# Patient Record
Sex: Female | Born: 1990 | Race: White | Hispanic: No | Marital: Single | Smoking: Never smoker
Health system: Southern US, Community
[De-identification: ages and names within clinical notes are randomized; demographics above are authoritative.]

## PROBLEM LIST (undated history)

## (undated) ENCOUNTER — Inpatient Hospital Stay (HOSPITAL_COMMUNITY): Payer: Self-pay

## (undated) DIAGNOSIS — Z9889 Other specified postprocedural states: Secondary | ICD-10-CM

## (undated) DIAGNOSIS — R51 Headache: Secondary | ICD-10-CM

## (undated) DIAGNOSIS — R011 Cardiac murmur, unspecified: Secondary | ICD-10-CM

## (undated) DIAGNOSIS — R519 Headache, unspecified: Secondary | ICD-10-CM

## (undated) DIAGNOSIS — R112 Nausea with vomiting, unspecified: Secondary | ICD-10-CM

## (undated) HISTORY — PX: MOUTH SURGERY: SHX715

## (undated) HISTORY — PX: TONSILLECTOMY: SUR1361

## (undated) HISTORY — DX: Cardiac murmur, unspecified: R01.1

## (undated) HISTORY — PX: AORTIC VALVE SURGERY: SHX549

---
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
## 2012-01-02 ENCOUNTER — Ambulatory Visit (INDEPENDENT_AMBULATORY_CARE_PROVIDER_SITE_OTHER): Payer: Medicaid Other | Admitting: Obstetrics and Gynecology

## 2012-01-02 ENCOUNTER — Encounter: Payer: Self-pay | Admitting: Obstetrics and Gynecology

## 2012-01-02 VITALS — BP 111/70 | HR 79 | Temp 97.8°F | Ht 60.0 in | Wt 158.0 lb

## 2012-01-02 DIAGNOSIS — Z309 Encounter for contraceptive management, unspecified: Secondary | ICD-10-CM

## 2012-01-02 MED ORDER — NORELGESTROMIN-ETH ESTRADIOL 150-35 MCG/24HR TD PTWK: 1.0000 | MEDICATED_PATCH | TRANSDERMAL | Status: DC

## 2012-01-02 NOTE — Progress Notes (Signed)
CC: Contraception       HPI Jo [name] ia a 21 y.o. G1P1001 here to initiate contraception. She does not want OCPs because of eating amenorrheic after taking the pills in the past. Does not want  IUD or new her due to concerns with her cervix as her mother had cervical cancer. Does not want V. She does not like injections. Does not want the implant.  Amenorrheic and no vaginal bleeding since her baby was born one year ago. She is breast-feeding 4 times a day. She would like to talk to lactation consultant regarding weaning.  Past Medical History  Diagnosis Date  . Heart murmur     bicuspid aorta valve    OB History    Grav Para Term Preterm Abortions TAB SAB Ect Mult Living   1 1 1       1      # Outc Date GA Lbr Len/2nd Wgt Sex Del Anes PTL Lv   1 TRM 10/12 [redacted]w[redacted]d 33:19 / 05:13 8lb15.9oz(4.08kg) F VAC EPI  Yes   Comments: Has a sacral dimple that appears deep.  Further evaluation with imaging study can be done.      Past Surgical History  Procedure Date  . No past surgeries   . Aortic valve surgery 1994    Stated in Prenatal Record that she had surgery as baby to repair valve, but told our MD  the valve corrected itself    History   Social History  . Marital Status: Single    Spouse Name: N/A    Number of Children: N/A  . Years of Education: N/A   Occupational History  . Not on file.   Social History Main Topics  . Smoking status: Never Smoker   . Smokeless tobacco: Never Used  . Alcohol Use: No  . Drug Use: No  . Sexually Active: Yes   Other Topics Concern  . Not on file   Social History Narrative  . No narrative on file    Current Outpatient Prescriptions on File Prior to Visit  Medication Sig Dispense Refill  . prenatal vitamin w/FE, FA (PRENATAL 1 + 1) 27-1 MG TABS Take 1 tablet by mouth at bedtime.        . ferrous sulfate (FERROUSUL) 325 (65 FE) MG tablet Take 1 tablet (325 mg total) by mouth 2 (two) times daily.  60 tablet  11  . norethindrone  (ORTHO MICRONOR) 0.35 MG tablet Take 1 tablet (0.35 mg total) by mouth daily.  1 Package  13    Allergies  Allergen Reactions  . Lemon Flavor   . Orange Games developer   . Aspirin Other (See Comments)    Pt states that the drug does not "break-down"  in her body.  . Penicillins Other (See Comments)    Happened in childhood,reaction unknown    ROS Pertinent items in HPI  PHYSICAL EXAM General: Well nourished, well developed female in no acute distress Cardiovascular: Normal rate Respiratory: Normal effort Abdomen: Soft, nontender Back: No CVAT Extremities: No edema Neurologic: Alert and oriented   LAB RESULTS  UPT neg  IASSESSMENT 1. Contraceptive management   Overweight  PLAN Counselor at length re: contraception methods encouraging LARC. Understands that she does not have a contraindication and understands effectiveness rates. She decided on contraceptive patch and prescription given. She knows to have it negative pregnancy test once more after being abstinent for 2 weeks and then start it.  See AVS for patient education   Med  List    Warning       Cannot display discharge medications because this is not an admission.          Danae Orleans, CNM 01/02/2012 10:28 AM

## 2012-01-02 NOTE — Patient Instructions (Addendum)
Estradiol; Norethindrone skin patches What is this medicine? ESTRADIOL; NORETHINDRONE (es tra DYE ole; nor eth IN drone) contains a mixture of female hormones. This medicine helps to relieve the symptoms of menopause like hot flashes, night sweats, mood changes, and vaginal dryness and irritation. It is also used to treat women with low estrogen levels or those who have had their ovaries removed. This medicine may be used for other purposes; ask your health care provider or pharmacist if you have questions. What should I tell my health care provider before I take this medicine? They need to know if you have any of these conditions: -blood vessel disease or blood clots -breast, cervical, endometrial, or uterine cancer -diabetes -endometriosis -fibroids -gallbladder disease -heart disease or recent heart attack -high blood cholesterol -high blood pressure -high level of calcium in the blood -hysterectomy -kidney disease -liver disease -mental depression -migraine headaches -porphyria -stroke -systemic lupus erythematosus (SLE) -tobacco smoker -vaginal bleeding -an unusual or allergic reaction to estrogens, progestins, other medicines, foods, dyes, or preservatives -pregnant or trying to get pregnant -breast-feeding How should I use this medicine? This medicine is for external use only. Follow the directions on the prescription label. Use exactly as directed. Tear open the pouch, do not use scissors. Remove the stiff protective liner covering the adhesive. Try not to touch the adhesive. Apply the patch, sticky side to the skin, to an area of the lower abdomen that is clean, dry and hairless. Avoid injured, irritated, calloused, or scarred areas. Do not apply the skin patches to your breasts or around the waist area. Use a different site each time to prevent skin irritation. You should change your patch on the same days each week. Do not cut or trim the patch. Do not stop using except on  the advice of your doctor or health care professional. Talk to your pediatrician regarding the use of this medicine in children. Special care may be needed. A patient package insert for the product will be given with each prescription and refill. Read this sheet carefully each time. The sheet may change frequently. Overdosage: If you think you have taken too much of this medicine contact a poison control center or emergency room at once. NOTE: This medicine is only for you. Do not share this medicine with others. What if I miss a dose? If you forget to change your patch as scheduled, apply it as soon as possible. Remember to remove the old patch. If it is almost time to apply the next patch, skip the missed patch and get back on your normal schedule. Do not wear more than one patch at a time unless you are told to do so by your doctor or health care professional. What may interact with this medicine? Do not take this medicine with any of the following medications: -aromatase inhibitors like aminoglutethimide, anastrozole, exemestane, letrozole, testolactone This medicine may also interact with the following medications: -barbiturates, such as phenobarbital -benzodiazepines -bosentan -bromocriptine -carbamazepine -cimetidine -cyclosporine -dantrolene -grapefruit juice -griseofulvin -hydrocortisone, cortisone, or prednisolone -isoniazid (INH) -medications for diabetes -methotrexate -mineral oil -phenytoin -raloxifene -rifabutin, rifampin, or rifapentine -tamoxifen -thyroid hormones -topiramate -tricyclic antidepressants -warfarin This list may not describe all possible interactions. Give your health care provider a list of all the medicines, herbs, non-prescription drugs, or dietary supplements you use. Also tell them if you smoke, drink alcohol, or use illegal drugs. Some items may interact with your medicine. What should I watch for while using this medicine? Visit your health  care professional  for regular checks on your progress. You should have a complete check-up every 6 months. You will need a regular breast and pelvic exam. You should also discuss the need for regular mammograms with your health care professional, and follow his or her guidelines. This medicine can make your body retain fluid, making your fingers, hands, or ankles swell. Your blood pressure can go up. Contact your doctor or health care professional if you feel you are retaining fluid. If you have any reason to think you are pregnant; stop taking this medicine at once and contact your doctor or health care professional. Tobacco smoking increases the risk of getting a blood clot or having a stroke, especially if you are more than 19 years old. You are strongly advised not to smoke. If you wear contact lenses and notice visual changes, or if the lenses begin to feel uncomfortable, consult your eye care specialist. If you are going to have elective surgery, you may need to stop taking this medicine beforehand. Consult your health care professional for advice prior to scheduling the surgery. If you are going to have a MRI procedure, let your MRI technician know about the use of these patches. Some drug patches contain an aluminized backing that can become heated when exposed to MRI and may cause burns. You may need to temporarily remove the patch during the MRI procedure. You may bathe or participate in other activities while wearing your patch. If the patch pulls loose or falls off, you may reapply it if the patch is sticky enough to stay on the skin. You should reapply the patch in a different area. Otherwise use a fresh patch. What side effects may I notice from receiving this medicine? Side effects that you should report to your doctor or health care professional as soon as possible: -allergic reactions like skin rash, itching or hives, swelling of the face, lips, or tongue -breast tissue changes or  discharge -changes in vision -chest pain -confusion, trouble speaking or understanding -dark urine -general ill feeling or flu-like symptoms -light-colored stools -nausea, vomiting -pain, swelling, warmth in the leg -right upper belly pain -severe headaches -shortness of breath -sudden numbness or weakness of the face, arm or leg -trouble walking, dizziness, loss of balance or coordination -unusual vaginal bleeding -yellowing of the eyes or skin Side effects that usually do not require medical attention (report to your doctor or health care professional if they continue or are bothersome): -acne -brown spots on the face -change in appetite -change in sexual desire -depressed mood or mood swings -fluid retention and swelling -stomach cramps or bloating -unusually weak or tired -weight gain This list may not describe all possible side effects. Call your doctor for medical advice about side effects. You may report side effects to FDA at 1-800-FDA-1088. Where should I keep my medicine? Keep out of the reach of children. Store at room temperature between 15 and 30 degrees C (59 and 86 degrees F) in the sealed foil pouch. Throw away any unused medicine after 6 months or the expiration date on the package, whichever is sooner. NOTE: This sheet is a summary. It may not cover all possible information. If you have questions about this medicine, talk to your doctor, pharmacist, or health care provider.  2012, [employer]. (02/10/2008 2:04:37 PM)

## 2012-06-21 ENCOUNTER — Encounter (HOSPITAL_COMMUNITY): Payer: Self-pay | Admitting: *Deleted

## 2012-06-21 ENCOUNTER — Inpatient Hospital Stay (HOSPITAL_COMMUNITY)
Admission: AD | Admit: 2012-06-21 | Discharge: 2012-06-21 | Disposition: A | Discharge status: Home / Self Care | Payer: Medicaid Other | Source: Ambulatory Visit | Attending: Obstetrics & Gynecology | Admitting: Obstetrics & Gynecology

## 2012-06-21 DIAGNOSIS — Z0000 Encounter for general adult medical examination without abnormal findings: Secondary | ICD-10-CM

## 2012-06-21 DIAGNOSIS — Z711 Person with feared health complaint in whom no diagnosis is made: Secondary | ICD-10-CM | POA: Insufficient documentation

## 2012-06-21 NOTE — MAU Note (Signed)
Put in a tampon last night.  Was unable to reach it today.  Called office, they said for her to come in tomorrow, she was afraid she would get toxic shock.

## 2012-06-21 NOTE — MAU Provider Note (Signed)
History     CSN: [id number]  Arrival date and time: 06/21/12 4098   First Provider Initiated Contact with Patient 06/21/12 1840      Chief Complaint  Patient presents with  . stuck tampon    HPI [name] is a 22 y.o. G1P1001 who presents to MAU today with complaint of a lost tampon. The patient states that she put a tampon in last night and doesn't remember taking it out, but could not find it today. She called MAU to ask what she should do and they told her to come in for evaluation to ensure it wasn't still in the vagina. She denies pain, abnormal discharge or fever.   OB History   Grav Para Term Preterm Abortions TAB SAB Ect Mult Living   1 1 1       1       Past Medical History  Diagnosis Date  . Heart murmur     bicuspid aorta valve    Past Surgical History  Procedure Laterality Date  . No past surgeries    . Aortic valve surgery  1994    Stated in Prenatal Record that she had surgery as baby to repair valve, but told our MD  the valve corrected itself    Family History  Problem Relation Age of Onset  . Cancer Mother     cervical  . Miscarriages / Stillbirths Mother     3 miscarriages  . Arthritis Mother   . Depression Mother   . Learning disabilities Brother     ADHD  . Asthma Brother   . Birth defects Brother   . Cancer Maternal Aunt     cervical  . Mental illness Maternal Aunt   . Drug abuse Maternal Aunt   . Diabetes Paternal Aunt   . Drug abuse Paternal Aunt   . Diabetes Maternal Grandmother   . Heart disease Maternal Grandmother   . Hypertension Maternal Grandmother   . Heart disease Maternal Grandfather   . Alcohol abuse Maternal Grandfather   . Alcohol abuse Father   . Drug abuse Father   . Mental illness Father   . Heart disease Paternal Grandmother   . Anesthesia problems Neg Hx   . Hypotension Neg Hx   . Malignant hyperthermia Neg Hx   . Pseudochol deficiency Neg Hx     History  Substance Use Topics  . Smoking status:  Never Smoker   . Smokeless tobacco: Never Used  . Alcohol Use: No    Allergies:  Allergies  Allergen Reactions  . Lemon Flavor   . Orange Games developer   . Aspirin Other (See Comments)    Pt states that the drug does not "break-down"  in her body.  . Penicillins Other (See Comments)    Happened in childhood,reaction unknown    Prescriptions prior to admission  Medication Sig Dispense Refill  . ferrous sulfate (FERROUSUL) 325 (65 FE) MG tablet Take 1 tablet (325 mg total) by mouth 2 (two) times daily.  60 tablet  11  . norelgestromin-ethinyl estradiol (ORTHO EVRA) 150-20 MCG/24HR transdermal patch Place 1 patch onto the skin once a week.  3 patch  12  . prenatal vitamin w/FE, FA (PRENATAL 1 + 1) 27-1 MG TABS Take 1 tablet by mouth at bedtime.          Review of Systems  Constitutional: Negative for fever.  Gastrointestinal: Negative for abdominal pain.  Genitourinary:       Neg - vaginal discharge +  vaginal bleeding   Physical Exam   Blood pressure 120/67, pulse 67, temperature 98.6 F (37 C), temperature source Oral, resp. rate 18, height 4' 10.25" (1.48 m), weight 151 lb (68.493 kg), last menstrual period 06/18/2012, not currently breastfeeding.  Physical Exam  Constitutional: She is oriented to person, place, and time. She appears well-developed and well-nourished. No distress.  HENT:  Head: Normocephalic and atraumatic.  Cardiovascular: Normal rate.   Respiratory: Effort normal.  GI: Soft.  Genitourinary: Uterus normal. Cervix exhibits no motion tenderness, no discharge and no friability. There is bleeding around the vagina. No foreign body around the vagina.  Neurological: She is alert and oriented to person, place, and time.  Skin: Skin is warm and dry. No erythema.  Psychiatric: She has a normal mood and affect.    MAU Course  Procedures None  MDM No evidence of retained tampon on exam  Assessment and Plan  A: Normal female exam  P: Discharge home Patient  may return to MAU as needed  Freddi Starr, PA-C  06/21/2012, 6:40 PM

## 2012-06-21 NOTE — MAU Provider Note (Signed)
Attestation of Attending Supervision of Advanced Practitioner (CNM/NP): Evaluation and management procedures were performed by the Advanced Practitioner under my supervision and collaboration. I have reviewed the Advanced Practitioner's note and chart, and I agree with the management and plan.  Jaquari Reckner H. 10:03 PM

## 2012-06-25 IMAGING — US US OB COMP +14 WK
1 series · 12 of 28 positions shown · non-contrast
Comparison: none

[Series 1: us ob detail +14 wk · 12 of 49 slices shown]
[im 2/49]
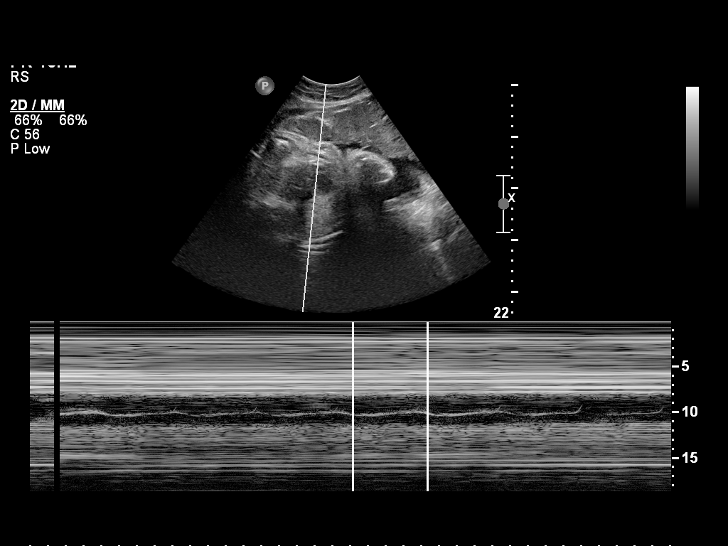
[im 6/49]
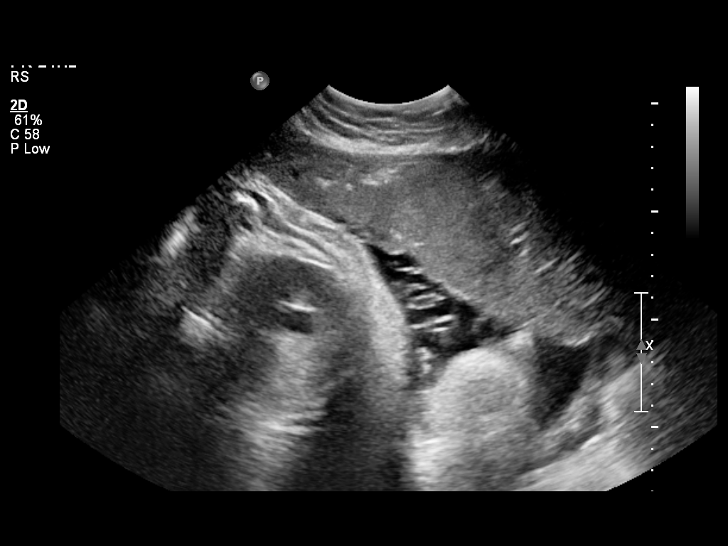
[im 9/49]
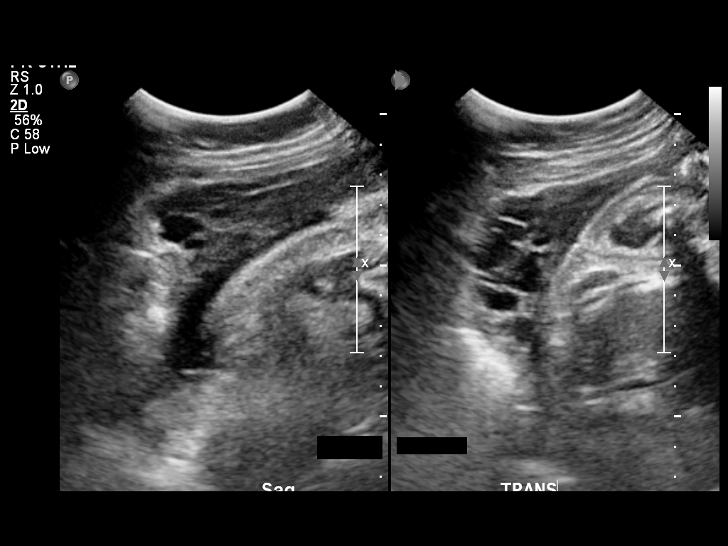
[im 15/49]
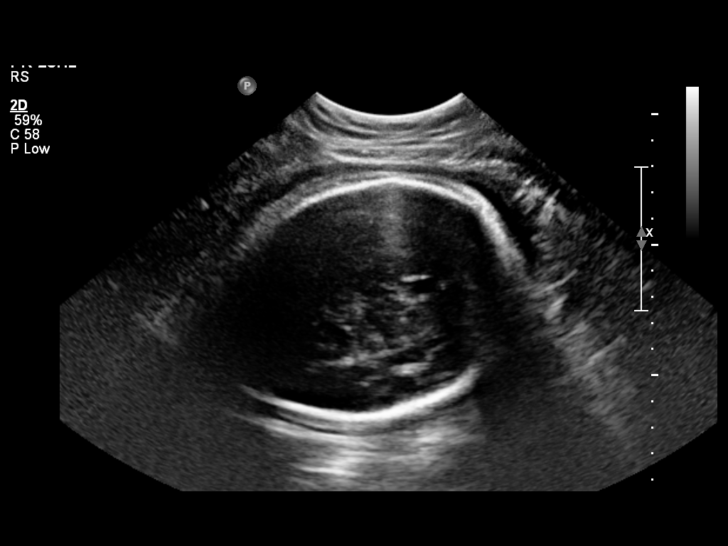
[im 18/49]
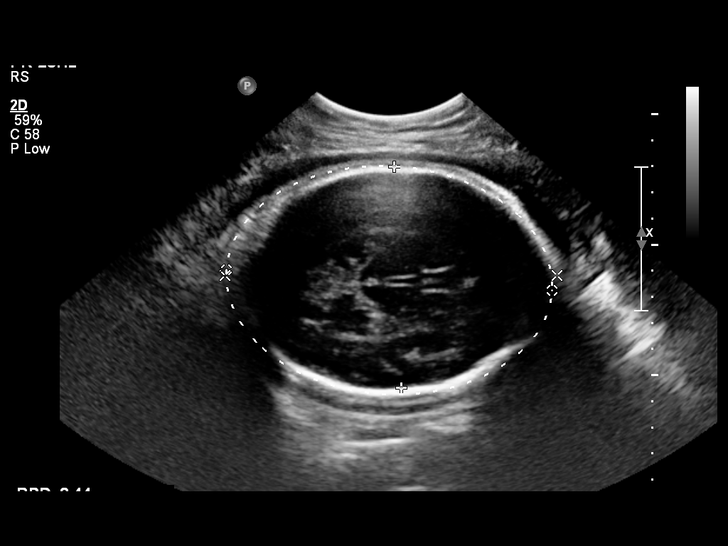
[im 22/49]
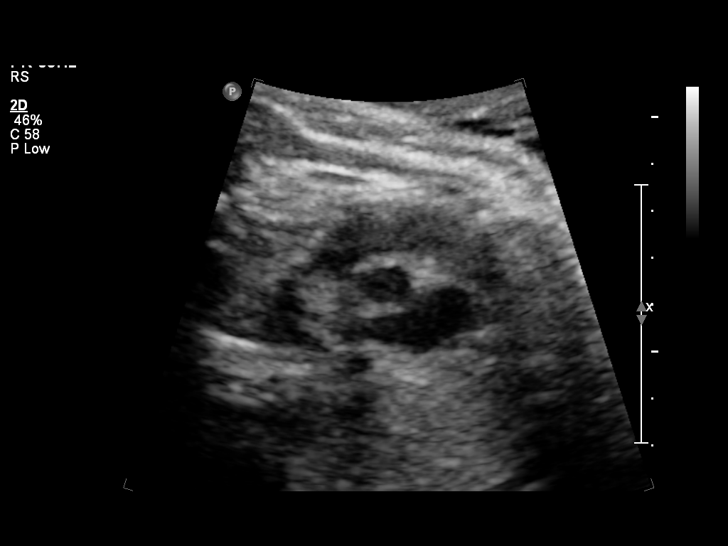
[im 27/49]
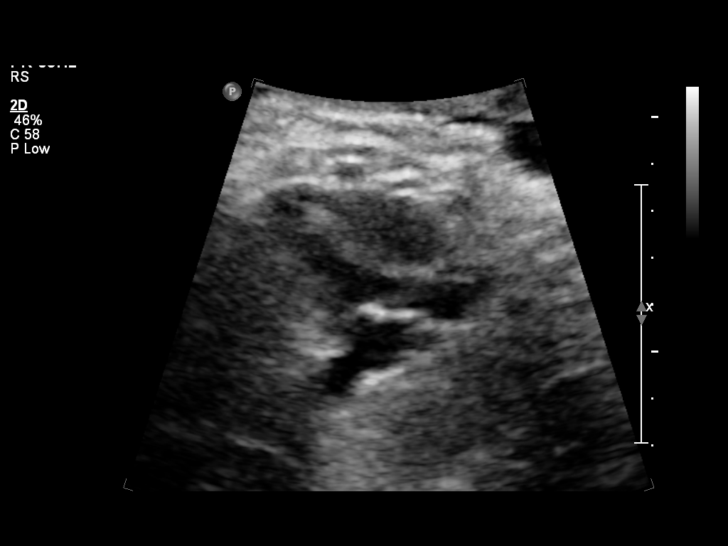
[im 31/49]
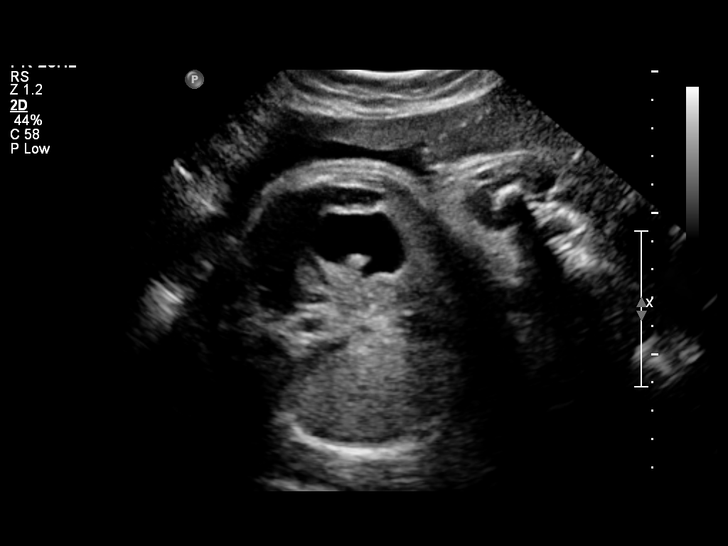
[im 34/49]
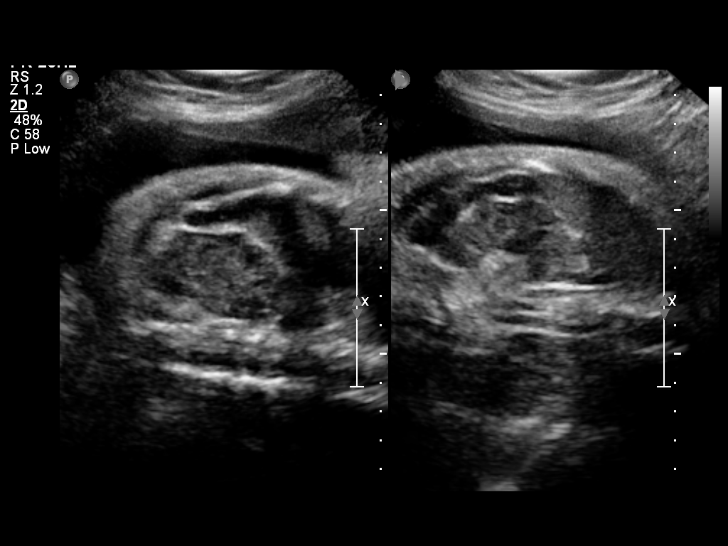
[im 40/49]
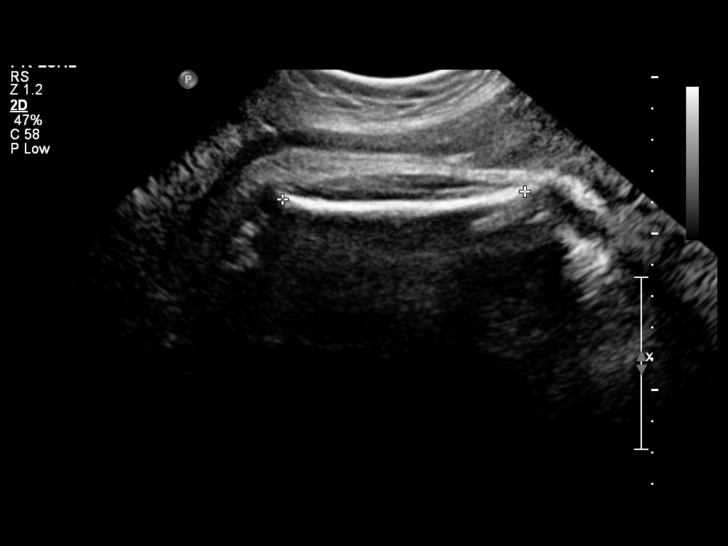
[im 43/49]
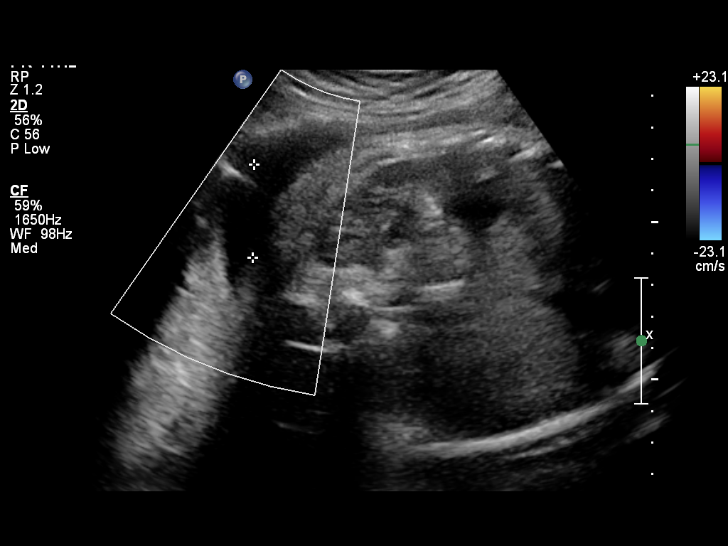
[im 47/49]
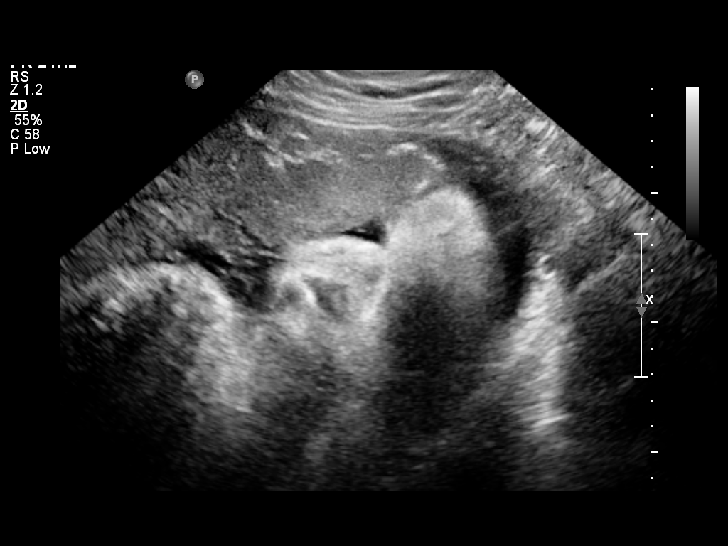

[12 of 28 positions shown; findings below may reference images not displayed]

OBSTETRICS REPORT
                      (Signed Final 12/11/2010 [DATE])

                 DO
 Order#:         [id number]
Procedures

 US OB Comp + 14 WK                                    76805.1
Indications

 Size greater than dates (Large for gestational [AGE]
Fetal Evaluation

 Fetal Heart Rate:  143                          bpm
 Cardiac Activity:  Observed
 Presentation:      Cephalic
 Placenta:          Anterior, above cervical os
 P. Cord            Not well visualized
 Insertion:

 Amniotic Fluid
 AFI FV:      Subjectively within normal limits
 AFI Sum:     14.68   cm       55  %Tile     Larg Pckt:    5.43  cm
 RUQ:   2.95    cm   RLQ:    3.58   cm    LUQ:   5.43    cm   LLQ:    2.72   cm
Biometry

 BPD:     85.7  mm     G. Age:  34w 4d                CI:         67.6   70 - 86
 OFD:    126.7  mm                                    FL/HC:      22.5   20.8 -

 HC:     336.5  mm     G. Age:  38w 4d       62  %    HC/AC:      0.99   0.92 -

 AC:       339  mm     G. Age:  37w 6d       82  %    FL/BPD:     88.4   71 - 87
 FL:      75.8  mm     G. Age:  38w 6d       87  %    FL/AC:      22.4   20 - 24
 Est. FW:    0273  gm      7 lb 4 oz     81  %
Gestational Age

 Clinical EDD:  37w 0d                                        EDD:   01/01/11
 U/S Today:     37w 3d                                        EDD:   12/29/10
 Best:          37w 0d     Det. By:  Clinical EDD             EDD:   01/01/11
Anatomy

 Cranium:           Appears normal      Aortic Arch:       Not well
                                                           visualized
 Fetal Cavum:       Appears normal      Ductal Arch:       Not well
                                                           visualized
 Ventricles:        Appears normal      Diaphragm:         Not well
                                                           visualized
 Choroid Plexus:    Appears normal      Stomach:           Appears
                                                           normal, left
                                                           sided
 Cerebellum:        Appears normal      Abdomen:           Appears normal
 Posterior Fossa:   Not well            Abdominal Wall:    Not well
                    visualized                             visualized
 Nuchal Fold:       Not applicable      Cord Vessels:      Appears normal
                    (>20 wks GA)                           (3 vessel cord)
 Face:              Lips appear         Kidneys:           Appear normal
                    normal
 Heart:             Appears normal      Bladder:           Appears normal
                    (4 chamber &
                    axis)
 RVOT:              Appears normal      Spine:             Not well
                                                           visualized
 LVOT:              Appears normal      Limbs:             Not well
                                                           visualized

 Other:     Technically difficult due to advanced GA and fetal
            position. Although spine was not well visualized, there
            are no intracranial findings of spina bifida.
Cervix Uterus Adnexa

 Cervix:       Not visualized (advanced GA >34 wks)

 Adnexa:     No abnormality visualized.
Impression

 Assigned GA is currently 37w 0d by clinical EDD.  EFW at 81
 %ile for this YAHIR.
 Suboptimal visualization of anatomy due to advanced GA,
 however no fetal anomalies identified.
 Amniotic fluid within normal limits, with AFI of 14.68 cm.

## 2012-12-07 ENCOUNTER — Inpatient Hospital Stay (EMERGENCY_DEPARTMENT_HOSPITAL)
Admission: AD | Admit: 2012-12-07 | Discharge: 2012-12-08 | Disposition: A | Discharge status: Home / Self Care | Payer: Medicaid Other | Source: Ambulatory Visit | Attending: Obstetrics & Gynecology | Admitting: Obstetrics & Gynecology

## 2012-12-07 DIAGNOSIS — T1590XA Foreign body on external eye, part unspecified, unspecified eye, initial encounter: Secondary | ICD-10-CM | POA: Insufficient documentation

## 2012-12-07 DIAGNOSIS — H5789 Other specified disorders of eye and adnexa: Secondary | ICD-10-CM | POA: Insufficient documentation

## 2012-12-07 DIAGNOSIS — T1592XA Foreign body on external eye, part unspecified, left eye, initial encounter: Secondary | ICD-10-CM

## 2012-12-07 NOTE — MAU Note (Signed)
Pt reports she got something in her eye about 1 pm today and it has worsened over the day, states she feels something scratching her eye. Left eye is swollen, red and weeping.

## 2012-12-08 ENCOUNTER — Encounter (HOSPITAL_COMMUNITY): Payer: Self-pay | Admitting: Emergency Medicine

## 2012-12-08 ENCOUNTER — Emergency Department (HOSPITAL_COMMUNITY)
Admission: EM | Admit: 2012-12-08 | Discharge: 2012-12-08 | Disposition: A | Discharge status: Home / Self Care | Payer: Medicaid Other | Attending: Emergency Medicine | Admitting: Emergency Medicine

## 2012-12-08 DIAGNOSIS — Z880 Allergy status to penicillin: Secondary | ICD-10-CM

## 2012-12-08 DIAGNOSIS — R011 Cardiac murmur, unspecified: Secondary | ICD-10-CM

## 2012-12-08 DIAGNOSIS — H5789 Other specified disorders of eye and adnexa: Secondary | ICD-10-CM | POA: Insufficient documentation

## 2012-12-08 DIAGNOSIS — Z79899 Other long term (current) drug therapy: Secondary | ICD-10-CM

## 2012-12-08 DIAGNOSIS — H5712 Ocular pain, left eye: Secondary | ICD-10-CM

## 2012-12-08 MED ORDER — TETRACAINE HCL 0.5 % OP SOLN
1.0000 [drp] | Freq: Once | OPHTHALMIC | Status: AC
  Administered 2012-12-08: 1 [drp] via OPHTHALMIC
  Filled 2012-12-08: qty 2

## 2012-12-08 MED ORDER — FLUORESCEIN SODIUM 1 MG OP STRP
1.0000 | ORAL_STRIP | Freq: Once | OPHTHALMIC | Status: AC
  Administered 2012-12-08: 1 via OPHTHALMIC
  Filled 2012-12-08: qty 1

## 2012-12-08 MED ORDER — CIPROFLOXACIN HCL 0.3 % OP OINT: TOPICAL_OINTMENT | Freq: Three times a day (TID) | OPHTHALMIC | Status: DC

## 2012-12-08 MED ORDER — CIPROFLOXACIN HCL 0.3 % OP SOLN
2.0000 [drp] | OPHTHALMIC | Status: DC
  Administered 2012-12-08: 2 [drp] via OPHTHALMIC
  Filled 2012-12-08: qty 2.5

## 2012-12-08 NOTE — ED Provider Notes (Signed)
CSN: [id number]     Arrival date & time 12/08/12  0100 History   First MD Initiated Contact with Patient 12/08/12 0402     Chief Complaint  Patient presents with  . Eye Problem   (Consider location/radiation/quality/duration/timing/severity/associated sxs/prior Treatment) HPI History provided by patient. Left eye discomfort and foreign body sensation 1 PM yesterday. Patient was at home cleaning with onset of symptoms. No known foreign body. No change in vision. She does not wear contacts. No headache. She tried flushing her eye out at home without relief. Having trouble sleeping tonight the discomfort and presents here for evaluation. No history of same. No cough, runny nose or recent illness. Pain moderate in severity  Past Medical History  Diagnosis Date  . Heart murmur     bicuspid aorta valve   Past Surgical History  Procedure Laterality Date  . No past surgeries    . Aortic valve surgery  1994    Stated in Prenatal Record that she had surgery as baby to repair valve, but told our MD  the valve corrected itself  . Mouth surgery     Family History  Problem Relation Age of Onset  . Cancer Mother     cervical  . Miscarriages / Stillbirths Mother     3 miscarriages  . Arthritis Mother   . Depression Mother   . Learning disabilities Brother     ADHD  . Asthma Brother   . Birth defects Brother   . Cancer Maternal Aunt     cervical  . Mental illness Maternal Aunt   . Drug abuse Maternal Aunt   . Diabetes Paternal Aunt   . Drug abuse Paternal Aunt   . Diabetes Maternal Grandmother   . Heart disease Maternal Grandmother   . Hypertension Maternal Grandmother   . Heart disease Maternal Grandfather   . Alcohol abuse Maternal Grandfather   . Alcohol abuse Father   . Drug abuse Father   . Mental illness Father   . Heart disease Paternal Grandmother   . Anesthesia problems Neg Hx   . Hypotension Neg Hx   . Malignant hyperthermia Neg Hx   . Pseudochol deficiency Neg Hx     History  Substance Use Topics  . Smoking status: Never Smoker   . Smokeless tobacco: Never Used  . Alcohol Use: No   OB History   Grav Para Term Preterm Abortions TAB SAB Ect Mult Living   1 1 1       1      Review of Systems  Constitutional: Negative for fever and chills.  HENT: Negative for neck pain and neck stiffness.   Eyes: Positive for pain and redness. Negative for discharge and visual disturbance.  Respiratory: Negative for shortness of breath.   Cardiovascular: Negative for chest pain.  Gastrointestinal: Negative for abdominal pain.  Genitourinary: Negative for dysuria.  Musculoskeletal: Negative for back pain.  Skin: Negative for rash.  Neurological: Negative for headaches.  All other systems reviewed and are negative.    Allergies  Lemon flavor; Orange flavor; Aspirin; and Penicillins  Home Medications   Current Outpatient Rx  Name  Route  Sig  Dispense  Refill  . phentermine 37.5 MG capsule   Oral   Take 37.5 mg by mouth every morning.          BP 108/77  Pulse 70  Temp(Src) 97.9 F (36.6 C) (Oral)  Resp 18  Ht 5' (1.524 m)  Wt 132 lb (59.875 kg)  BMI  25.78 kg/m2  SpO2 100% Physical Exam  Constitutional: She is oriented to person, place, and time. She appears well-developed and well-nourished.  HENT:  Head: Normocephalic and atraumatic.  Eyes: EOM are normal. Pupils are equal, round, and reactive to light.  Left eye: Periorbital area normal without swelling or erythema. There is conjunctival injection. Normal anterior chamber. No foreign body by direct visualization. No discharge.  Woods lamp exam after tetracaine and fluorescein: No dye uptake. No foreign body identified. Pain improved  Neck: Neck supple.  Cardiovascular: Regular rhythm and intact distal pulses.   Pulmonary/Chest: Effort normal. No respiratory distress.  Musculoskeletal: Normal range of motion. She exhibits no edema.  Neurological: She is alert and oriented to person,  place, and time.  Skin: Skin is warm and dry.    ED Course  Procedures (including critical care time)  Tetracaine, fluorescein, Woods lamp exam Left eye flushed with normal saline  Symptomatically improved. Ciprofloxacin eyedrops provided with ophthalmology referral. Return precautions provided and verbalized as understood.  MDM  Diagnosis: Left eye pain/ foreign body sensation  Medications provided and symptomatically improved Vital signs and nursing notes reviewed and considered    Sunnie Nielsen, MD 12/08/12 [phone]

## 2012-12-08 NOTE — ED Notes (Signed)
Pt states she has something in her left eye and is unable to get it out  Pt's eye is red and watery

## 2012-12-08 NOTE — MAU Note (Signed)
Pt states she feels like she got something in her eye when she went to a closet that is not often utilized

## 2012-12-08 NOTE — Progress Notes (Signed)
Pt's eye appears red and swollen

## 2012-12-08 NOTE — MAU Provider Note (Signed)
Chief Complaint: Eye Problem  First Provider Initiated Contact with Patient 12/08/12 0022      SUBJECTIVE HPI: Rosiland Sen is a 22 y.o. G59P1001 non-pregnant female who presents with eye irritation and redness since this afternoon. She states she was getting something out of a closet and something fell in her eye. She has not been able to see anything in or remove anything from her eye.   States she came to [facility] because she didn't know where the other hospitals where.   Past Medical History  Diagnosis Date  . 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  Further evaluation with imaging study can be done.     Past Surgical History  Procedure Laterality Date  . No past surgeries    . Aortic valve surgery  1994    Stated in Prenatal Record that she had surgery as baby to repair valve, but told our MD  the valve corrected itself   History   Social History  . Marital Status: Single    Spouse Name: N/A    Number of Children: N/A  . Years of Education: N/A   Occupational History  . Not on file.   Social History Main Topics  . Smoking status: Never Smoker   . Smokeless tobacco: Never Used  . Alcohol Use: No  . Drug Use: No  . Sexual Activity: Yes    Birth Control/ Protection: Condom   Other Topics Concern  . Not on file   Social History Narrative  . No narrative on file   No current facility-administered medications on file prior to encounter.   Current Outpatient Prescriptions on File Prior to Encounter  Medication Sig Dispense Refill  . prenatal vitamin w/FE, FA (PRENATAL 1 + 1) 27-1 MG TABS Take 1 tablet by mouth at bedtime.         Allergies  Allergen Reactions  . Lemon Flavor   . Orange  Games developer   . Aspirin Other (See Comments)    Pt states that the drug does not "break-down"  in her body.  . Penicillins Other (See Comments)    Happened in childhood,reaction unknown    ROS: Pertinent positive items in HPI. Negative for fever, chills, purulent discharge, contacts with eye irritation or drainage, loss of vision or visualization of foreign body.    OBJECTIVE Blood pressure 102/72, pulse 80, temperature 97.7 F (36.5 C), temperature source Oral, resp. rate 16, height 5' (1.524 m), weight 59.875 kg (132 lb), SpO2 98.00%, not currently breastfeeding. GENERAL: Well-developed, well-nourished female in no acute distress.  HEENT: Normocephalic. Left conjunctive injected. Lid slightly swollen. Eye tearing. No purulent drainage. Vision grossly normal. No foreign body visible.  HEART: normal rate RESP: normal effort  LAB RESULTS No results found for this or any previous visit (from the past 24 hour(s)).  IMAGING No results found.  MAU COURSE  ASSESSMENT 1. Foreign body, eye, left, initial encounter    PLAN Discussed w/ pt that [facility] does not have the proper equipment or providers to adequately evaluate and treat foreign body vs  abrasion of eye. Offered transfer to [facility] ED for needed services. Pt declined twice. Recommended that pt call to get someone else to drive her. Declined. States her vision is normal and her driving is unimpaired by the problems w/ her eye. Will go to [facility] ED by private vehicle.      Follow-up Information   Follow up with [facility]-EMERGENCY DEPT.   Specialty:  Emergency Medicine   Contact information:   [address] [place] [id number] [place] [address] [phone]       Medication List    STOP taking these medications       ferrous sulfate 325 (65 FE) MG tablet  Commonly known as:  FERROUSUL     norelgestromin-ethinyl estradiol 150-35 MCG/24HR transdermal patch  Commonly known as:  ORTHO EVRA       TAKE these medications       prenatal vitamin w/FE, FA 27-1 MG Tabs tablet  Take 1 tablet by mouth at bedtime.       [place], CNM 12/08/2012  12:38 AM

## 2014-01-09 ENCOUNTER — Encounter (HOSPITAL_COMMUNITY): Payer: Self-pay | Admitting: Emergency Medicine

## 2014-02-04 ENCOUNTER — Encounter (HOSPITAL_COMMUNITY): Payer: Self-pay | Admitting: *Deleted

## 2014-02-04 ENCOUNTER — Emergency Department (HOSPITAL_COMMUNITY)
Admission: EM | Admit: 2014-02-04 | Discharge: 2014-02-05 | Disposition: A | Discharge status: Home / Self Care | Payer: Medicaid Other | Attending: Emergency Medicine | Admitting: Emergency Medicine

## 2014-02-04 DIAGNOSIS — J358 Other chronic diseases of tonsils and adenoids: Secondary | ICD-10-CM

## 2014-02-04 DIAGNOSIS — J039 Acute tonsillitis, unspecified: Secondary | ICD-10-CM | POA: Insufficient documentation

## 2014-02-04 DIAGNOSIS — J029 Acute pharyngitis, unspecified: Secondary | ICD-10-CM | POA: Diagnosis present

## 2014-02-04 DIAGNOSIS — R011 Cardiac murmur, unspecified: Secondary | ICD-10-CM | POA: Insufficient documentation

## 2014-02-04 DIAGNOSIS — Z792 Long term (current) use of antibiotics: Secondary | ICD-10-CM | POA: Diagnosis not present

## 2014-02-04 DIAGNOSIS — Z79899 Other long term (current) drug therapy: Secondary | ICD-10-CM | POA: Insufficient documentation

## 2014-02-04 DIAGNOSIS — Z8709 Personal history of other diseases of the respiratory system: Secondary | ICD-10-CM

## 2014-02-04 DIAGNOSIS — Z880 Allergy status to penicillin: Secondary | ICD-10-CM | POA: Diagnosis not present

## 2014-02-04 NOTE — ED Provider Notes (Signed)
CSN: [id number]     Arrival date & time 02/04/14  1922 History  This chart was scribed for non-physician practitioner, Dierdre Forth, PA-C,working with Vida Roller, MD, by Karle Plumber, ED Scribe. This patient was seen in room B14C/B14C and the patient's care was started at 11:45 PM.  Chief Complaint  Patient presents with  . Sore Throat    Tonsils   Patient is a 23 y.o. female presenting with pharyngitis. The history is provided by the patient. No language interpreter was used.  Sore Throat Pertinent negatives include no chest pain, no abdominal pain, no headaches and no shortness of breath.    HPI Comments:  Jo [name] is a 23 y.o. female who presents to the Emergency Department complaining of a moderate sore throat that began 2 days ago. She reports associated swelling of bilateral tonsils. She states she pushed on the left side and produced a piece of "hard pus" from the tonsil. Denies modifying factors. Pt reports she generally does not feel well. She reports h/o tonsillitis (2-3 times per winter) but states this feels different than that. She reports her throat feels more irritated than painful and she has been able to eat and drink without difficulty.  Denies any sick contacts. She denies fever, chills, nausea, abdominal pain or vomiting. She reports she has never discussed having her tonsils removed or been evaluated by ENT.    Past Medical History  Diagnosis Date  . Heart murmur     bicuspid aorta valve   Past Surgical History  Procedure Laterality Date  . No past surgeries    . Aortic valve surgery  1994    Stated in Prenatal Record that she had surgery as baby to repair valve, but told our MD  the valve corrected itself  . Mouth surgery     Family History  Problem Relation Age of Onset  . Cancer Mother     cervical  . Miscarriages / Stillbirths Mother     3 miscarriages  . Arthritis Mother   . Depression Mother   . Learning disabilities Brother     ADHD   . Asthma Brother   . Birth defects Brother   . Cancer Maternal Aunt     cervical  . Mental illness Maternal Aunt   . Drug abuse Maternal Aunt   . Diabetes Paternal Aunt   . Drug abuse Paternal Aunt   . Diabetes Maternal Grandmother   . Heart disease Maternal Grandmother   . Hypertension Maternal Grandmother   . Heart disease Maternal Grandfather   . Alcohol abuse Maternal Grandfather   . Alcohol abuse Father   . Drug abuse Father   . Mental illness Father   . Heart disease Paternal Grandmother   . Anesthesia problems Neg Hx   . Hypotension Neg Hx   . Malignant hyperthermia Neg Hx   . Pseudochol deficiency Neg Hx    History  Substance Use Topics  . Smoking status: Never Smoker   . Smokeless tobacco: Never Used  . Alcohol Use: No   OB History    Gravida Para Term Preterm AB TAB SAB Ectopic Multiple Living   1 1 1       1      Review of Systems  Constitutional: Negative for fever, chills, appetite change and fatigue.  HENT: Positive for sore throat (irritation). Negative for congestion, ear discharge, ear pain, mouth sores, postnasal drip, rhinorrhea and sinus pressure.   Eyes: Negative for visual disturbance.  Respiratory: Negative  for cough, chest tightness, shortness of breath, wheezing and stridor.   Cardiovascular: Negative for chest pain, palpitations and leg swelling.  Gastrointestinal: Negative for nausea, vomiting, abdominal pain and diarrhea.  Genitourinary: Negative for dysuria, urgency, frequency and hematuria.  Musculoskeletal: Negative for myalgias, back pain, arthralgias and neck stiffness.  Skin: Negative for rash.  Neurological: Negative for syncope, light-headedness, numbness and headaches.  Hematological: Negative for adenopathy.  Psychiatric/Behavioral: The patient is not nervous/anxious.   All other systems reviewed and are negative.   Allergies  Lemon flavor; Orange flavor; Aspirin; and Penicillins  Home Medications   Prior to Admission  medications   Medication Sig Start Date End Date Taking? Authorizing Provider  ciprofloxacin (CILOXAN) 0.3 % ophthalmic ointment Place into the left eye 3 (three) times daily. 12/08/12   Sunnie NielsenBrian Opitz, MD  phentermine 37.5 MG capsule Take 37.5 mg by mouth every morning.    Historical Provider, MD   Triage Vitals: BP 124/75 mmHg  Pulse 84  Temp(Src) 98.1 F (36.7 C) (Oral)  Resp 18  SpO2 99%  LMP 12/27/2013 Physical Exam  Constitutional: She appears well-developed and well-nourished. No distress.  HENT:  Head: Normocephalic and atraumatic.  Right Ear: Tympanic membrane, external ear and ear canal normal.  Left Ear: Tympanic membrane, external ear and ear canal normal.  Nose: Nose normal. No mucosal edema or rhinorrhea.  Mouth/Throat: Uvula is midline and mucous membranes are normal. Mucous membranes are not dry. No trismus in the jaw. No uvula swelling. No oropharyngeal exudate, posterior oropharyngeal edema, posterior oropharyngeal erythema or tonsillar abscesses.  Posterior oropharynx without erythema, edema and exudate on the tonsils; open crypts noted to the left tonsil Persistent small tonsillith located in the left tonsil No uvula deviation  Eyes: Conjunctivae are normal.  Neck: Normal range of motion, full passive range of motion without pain and phonation normal. No tracheal tenderness, no spinous process tenderness and no muscular tenderness present. No rigidity. No erythema and normal range of motion present. No Brudzinski's sign and no Kernig's sign noted.  Range of motion without pain  No midline or paraspinal tenderness Normal phonation; no hot potato voice No stridor Handling secretions without difficulty No nuchal rigidity or meningeal signs  Cardiovascular: Normal rate, regular rhythm, normal heart sounds and intact distal pulses.   No murmur heard. Pulses:      Radial pulses are 2+ on the right side, and 2+ on the left side.  Pulmonary/Chest: Effort normal and breath  sounds normal. No stridor. No respiratory distress. She has no decreased breath sounds. She has no wheezes.  Equal chest expansion, clear and equal breath sounds without focal wheezes, rhonchi or rales  Musculoskeletal: Normal range of motion.  Lymphadenopathy:       Head (right side): No submental, no submandibular, no tonsillar, no preauricular, no posterior auricular and no occipital adenopathy present.       Head (left side): No submental, no submandibular, no tonsillar, no preauricular, no posterior auricular and no occipital adenopathy present.    She has no cervical adenopathy.       Right cervical: No superficial cervical, no deep cervical and no posterior cervical adenopathy present.      Left cervical: No superficial cervical, no deep cervical and no posterior cervical adenopathy present.  No head or neck lymphadenopathy  Neurological: She is alert.  Alert and oriented Moves all extremities without ataxia  Skin: Skin is warm and dry. She is not diaphoretic.  Psychiatric: She has a normal mood and affect.  Nursing note and vitals reviewed.   ED Course  Procedures (including critical care time) DIAGNOSTIC STUDIES: Oxygen Saturation is 99% on RA, normal by my interpretation.   COORDINATION OF CARE: 11:50 PM- Will order rapid strep test. Pt verbalizes understanding and agrees to plan.  Medications - No data to display  Labs Review Labs Reviewed  RAPID STREP SCREEN  CULTURE, GROUP A STREP    Imaging Review No results found.   EKG Interpretation None      MDM   Final diagnoses:  Tonsillith  H/O pharyngitis   Jo [name] presents with sore throat, Hx of same and c/o of concern for tonsillitis.  On exam, oropharynx is without edema, erythema or exudate.  Small tonsillith and open crypts noted in the L tonsil.  Doubt bacterial pharyngitis, but pt is concerned; will obtain rapid strep.    Pt afebrile without tonsillar exudate, negative strep. Pt without cervical  lymphadenopathy.  Visible tonsillith may have contributed to pt's pain.  No bacterial oharyngitis. No abx indicated.  Pt does not appear dehydrated, but did discuss importance of water hydration. Presentation non concerning for PTA or infxn spread to soft tissue. No trismus or uvula deviation. Specific return precautions discussed. Pt able to drink water in ED without difficulty with intact air way. Recommended ENT and PCP follow up.  I have personally reviewed patient's vitals, nursing note and any pertinent labs or imaging.  I performed an focused physical exam; undressed when appropriate .    It has been determined that no acute conditions requiring further emergency intervention are present at this time. The patient/guardian have been advised of the diagnosis and plan. I reviewed any labs and imaging including any potential incidental findings. We have discussed signs and symptoms that warrant return to the ED and they are listed in the discharge instructions.    Vital signs are stable at discharge.   BP 104/77 mmHg  Pulse 59  Temp(Src) 98.1 F (36.7 C) (Oral)  Resp 16  SpO2 100%  LMP 12/27/2013  I personally performed the services described in this documentation, which was scribed in my presence. The recorded information has been reviewed and is accurate.    Dahlia ClientHannah Alizae Bechtel, PA-C 02/05/14 [phone]  Vida RollerBrian D Miller, MD 02/05/14 [phone]

## 2014-02-04 NOTE — ED Notes (Signed)
Pt is here with tonsil drainage and pain. Pt feels like left side is worse

## 2014-02-05 LAB — RAPID STREP SCREEN (MED CTR MEBANE ONLY): Streptococcus, Group A Screen (Direct): NEGATIVE

## 2014-02-05 NOTE — Discharge Instructions (Signed)
1. Medications: usual home medications 2. Treatment: rest, drink plenty of fluids, warm salt water gargle at night 3. Follow Up: Please followup with your primary doctor in 7 days for discussion of your diagnoses and further evaluation after today's visit; if you do not have a primary care doctor use the resource guide provided to find one; Please return to the ER for fever, chills, worsening sore throat, difficulty swallowing, difficulty breathing.

## 2014-02-05 NOTE — ED Notes (Signed)
Discharge and follow up instructions reviewed with pt. Pt verbalized understanding.  

## 2014-02-07 LAB — CULTURE, GROUP A STREP

## 2014-09-28 LAB — OB RESULTS CONSOLE GC/CHLAMYDIA
Chlamydia: NEGATIVE
GC PROBE AMP, GENITAL: NEGATIVE

## 2014-11-02 LAB — OB RESULTS CONSOLE ABO/RH: RH TYPE: NEGATIVE

## 2014-11-02 LAB — OB RESULTS CONSOLE HEPATITIS B SURFACE ANTIGEN: Hepatitis B Surface Ag: NEGATIVE

## 2014-11-02 LAB — OB RESULTS CONSOLE RPR: RPR: NONREACTIVE

## 2014-11-02 LAB — OB RESULTS CONSOLE ANTIBODY SCREEN: ANTIBODY SCREEN: NEGATIVE

## 2014-11-02 LAB — OB RESULTS CONSOLE HIV ANTIBODY (ROUTINE TESTING): HIV: NONREACTIVE

## 2014-11-02 LAB — OB RESULTS CONSOLE RUBELLA ANTIBODY, IGM: Rubella: IMMUNE

## 2015-03-11 NOTE — L&D Delivery Note (Signed)
Pt had an amniotomy this am with clear fluid. She was on pit aug. She progressed rapidly to C/C/+2. She pushed for 5 min. She had a SVD of one live viable white female infant in the ROA position. Nuchal cord x 1. Baby came out with poor tone and no resp effort. Code called. Art cord gas- 7.11. Baby responded rapidly to stimulation and O2. Placenta S/I. 2nd midline tear closed with 3-0 chromic . Baby to NBN.

## 2015-03-27 ENCOUNTER — Inpatient Hospital Stay (HOSPITAL_COMMUNITY)
Admission: AD | Admit: 2015-03-27 | Discharge: 2015-03-27 | Disposition: A | Discharge status: Home / Self Care | Payer: Medicaid Other | Source: Ambulatory Visit | Attending: Obstetrics and Gynecology | Admitting: Obstetrics and Gynecology

## 2015-03-27 ENCOUNTER — Encounter (HOSPITAL_COMMUNITY): Payer: Self-pay | Admitting: *Deleted

## 2015-03-27 DIAGNOSIS — O42913 Preterm premature rupture of membranes, unspecified as to length of time between rupture and onset of labor, third trimester: Secondary | ICD-10-CM | POA: Diagnosis present

## 2015-03-27 DIAGNOSIS — O26893 Other specified pregnancy related conditions, third trimester: Secondary | ICD-10-CM | POA: Diagnosis not present

## 2015-03-27 DIAGNOSIS — N898 Other specified noninflammatory disorders of vagina: Secondary | ICD-10-CM

## 2015-03-27 LAB — URINALYSIS, ROUTINE W REFLEX MICROSCOPIC
BILIRUBIN URINE: NEGATIVE
Glucose, UA: 100 mg/dL — AB
Hgb urine dipstick: NEGATIVE
KETONES UR: 15 mg/dL — AB
LEUKOCYTES UA: NEGATIVE
NITRITE: NEGATIVE
PROTEIN: NEGATIVE mg/dL
Specific Gravity, Urine: 1.025 (ref 1.005–1.030)
pH: 5.5 (ref 5.0–8.0)

## 2015-03-27 LAB — AMNISURE RUPTURE OF MEMBRANE (ROM) NOT AT ARMC: AMNISURE: NEGATIVE

## 2015-03-27 NOTE — MAU Note (Signed)
Pt states felt a gush in the morning that was clear and odorless and states that she has been continuing to feel small gushes all day.

## 2015-03-27 NOTE — Discharge Instructions (Signed)

## 2015-03-27 NOTE — MAU Provider Note (Signed)
Chief Complaint:  Abdominal Pain   First Provider Initiated Contact with Patient 03/27/15 1031      Jo [name] is a 25 y.o. G2P1001 at 37w4dwho presents to maternity admissions reporting leaking of fluid since this morning. States clothing wet, did not use pads.  Denies feeling contractions.  She reports good fetal movement, denies vaginal bleeding, vaginal itching/burning, urinary symptoms, h/a, dizziness, n/v, diarrhea, constipation or fever/chills.  She denies headache, visual changes or abdominal pain.   Vaginal Discharge The patient's primary symptoms include vaginal discharge. The patient's pertinent negatives include no genital itching, genital lesions, genital odor, pelvic pain or vaginal bleeding. This is a new problem. The current episode started today. The problem occurs intermittently. The problem has been unchanged. The patient is experiencing no pain. The problem affects both sides. She is pregnant. Pertinent negatives include no abdominal pain, back pain, chills, constipation, diarrhea, dysuria, fever, flank pain, nausea or vomiting. The vaginal discharge was clear and watery. There has been no bleeding. She has not been passing clots. She has not been passing tissue. Nothing aggravates the symptoms. She has tried nothing for the symptoms. She uses nothing for contraception.   Past Medical History: Past Medical History  Diagnosis Date  . Heart murmur     bicuspid aorta valve    Past obstetric history: OB History  Gravida Para Term Preterm AB SAB TAB Ectopic Multiple Living  # Outcome Date GA Lbr Len/2nd Weight Sex Delivery Anes PTL Lv  2 Current           1 Term 01/07/11 [redacted]w[redacted]d 33:19 / 05:13 4.08 kg (8 lb 15.9 oz) F Vag-Vacuum EPI  Y     Comments: Has a sacral dimple that appears deep.  Further evaluation with imaging study can be done.      Past Surgical History: Past Surgical History  Procedure Laterality Date  . Aortic valve surgery  1994     Stated in Prenatal Record that she had surgery as baby to repair valve, but told our MD  the valve corrected itself  . Mouth surgery      Family History: Family History  Problem Relation Age of Onset  . Cancer Mother     cervical  . Miscarriages / Stillbirths Mother     3 miscarriages  . Arthritis Mother   . Depression Mother   . Learning disabilities Brother     ADHD  . Asthma Brother   . Birth defects Brother   . Cancer Maternal Aunt     cervical  . Mental illness Maternal Aunt   . Drug abuse Maternal Aunt   . Diabetes Paternal Aunt   . Drug abuse Paternal Aunt   . Diabetes Maternal Grandmother   . Heart disease Maternal Grandmother   . Hypertension Maternal Grandmother   . Heart disease Maternal Grandfather   . Alcohol abuse Maternal Grandfather   . Alcohol abuse Father   . Drug abuse Father   . Mental illness Father   . Heart disease Paternal Grandmother   . Anesthesia problems Neg Hx   . Hypotension Neg Hx   . Malignant hyperthermia Neg Hx   . Pseudochol deficiency Neg Hx     Social History: Social History  Substance Use Topics  . Smoking status: Never Smoker   . Smokeless tobacco: Never Used  . Alcohol Use: No    Allergies:  Allergies  Allergen Reactions  . Luster Landsberg  Flavor Nausea And Vomiting  . Orange Flavor Nausea And Vomiting  . Aspirin Other (See Comments)    Pt states that the drug does not "break-down"  in her body.  . Penicillins Other (See Comments)    Happened in childhood,reaction unknown Has patient had a PCN reaction causing immediate rash, facial/tongue/throat swelling, SOB or lightheadedness with hypotension: unknown Has patient had a PCN reaction causing severe rash involving mucus membranes or skin necrosis: No Has patient had a PCN reaction that required hospitalization No Has patient had a PCN reaction occurring within the last 10 years: No If all of the above answers are "NO", then may proce    Meds:  Prescriptions prior to  admission  Medication Sig Dispense Refill Last Dose  . ferrous sulfate 325 (65 FE) MG tablet Take 325 mg by mouth daily with breakfast.   Past Week at Unknown time  . Prenatal Vit-Fe Fumarate-FA (PRENATAL MULTIVITAMIN) TABS tablet Take 1 tablet by mouth daily at 12 noon.   Past Week at Unknown time  . [DISCONTINUED] ciprofloxacin (CILOXAN) 0.3 % ophthalmic ointment Place into the left eye 3 (three) times daily. (Patient not taking: Reported on 03/27/2015) 3.5 g 0 Completed Course at Unknown time    I have reviewed patient's Past Medical Hx, Surgical Hx, Family Hx, Social Hx, medications and allergies.   ROS:  Review of Systems  Constitutional: Negative for fever and chills.  Gastrointestinal: Negative for nausea, vomiting, abdominal pain, diarrhea and constipation.  Genitourinary: Positive for vaginal discharge. Negative for dysuria, flank pain, vaginal bleeding and pelvic pain.  Musculoskeletal: Negative for back pain.  Other systems negative   Physical Exam  Patient Vitals for the past 24 hrs:  BP Temp Temp src Pulse Resp Height Weight  03/27/15 1008 111/64 mmHg 98.4 F (36.9 C) Oral 106 18  (1.499 m) 86.183 kg (190 lb)   Constitutional: Well-developed, well-nourished female in no acute distress.  Cardiovascular: normal rate and rhythm Respiratory: normal effort, clear to auscultate bilaterally GI: Abd soft, non-tender, gravid appropriate for gestational age.   No rebound or guarding. MS: Extremities nontender, no edema, normal ROM Neurologic: Alert and oriented x 4.  GU: Neg CVAT.  PELVIC EXAM: Cervix pink, visually closed, without lesion, scant white thin mucous discharge, vaginal walls and external genitalia normal Bimanual exam: Cervix firm, posterior, neg CMT, uterus nontender, Fundal Height consistent with dates, adnexa without tenderness, enlargement, or mass    FHT:  Baseline 140 , moderate variability, accelerations present, no decelerations Contractions: Rare    Labs: Results for orders placed or performed during the hospital encounter of 03/27/15 (from the past 24 hour(s))  Urinalysis, Routine w reflex microscopic (not at [facility])     Status: Abnormal   Collection Time: 03/27/15  9:56 AM  Result Value Ref Range   Color, Urine YELLOW YELLOW   APPearance CLEAR CLEAR   Specific Gravity, Urine 1.025 1.005 - 1.030   pH 5.5 5.0 - 8.0   Glucose, UA 100 (A) NEGATIVE mg/dL   Hgb urine dipstick NEGATIVE NEGATIVE   Bilirubin Urine NEGATIVE NEGATIVE   Ketones, ur 15 (A) NEGATIVE mg/dL   Protein, ur NEGATIVE NEGATIVE mg/dL   Nitrite NEGATIVE NEGATIVE   Leukocytes, UA NEGATIVE NEGATIVE      Imaging:  No results found.  MAU Course/MDM: I have ordered labs and reviewed results.  NST reviewed Fern slide inconclusive. There were fern-like patterns but not classical type. Amnisure sent. .Results for MAVI, UN (MRN [id number]) as of 03/27/2015  12:34  Ref. Range 03/27/2015 10:42  Amnisure ROM Unknown NEGATIVE   Consult Dr Dareen Piano with presentation, exam findings and test results.  Treatments in MAU included None.   Pt stable at time of discharge.   Assessment: SIUP at [redacted]w[redacted]d  Vaginal discharge No evidence of PPROM Reassuring fetal heart rate tracing  Plan: Discharge home Preterm Labor precautions and fetal kick counts Follow up in Office for prenatal visits and recheck as needed    Medication List    ASK your doctor about these medications        ferrous sulfate 325 (65 FE) MG tablet  Take 325 mg by mouth daily with breakfast.     prenatal multivitamin Tabs tablet  Take 1 tablet by mouth daily at 12 noon.        Wynelle Bourgeois CNM, MSN Certified Nurse-Midwife 03/27/2015 10:52 AM

## 2015-04-18 LAB — OB RESULTS CONSOLE GBS: STREP GROUP B AG: NEGATIVE

## 2015-05-09 ENCOUNTER — Inpatient Hospital Stay (HOSPITAL_COMMUNITY)
Admission: AD | Admit: 2015-05-09 | Discharge: 2015-05-09 | Disposition: A | Discharge status: Home / Self Care | Payer: Medicaid Other | Source: Ambulatory Visit | Attending: Obstetrics and Gynecology | Admitting: Obstetrics and Gynecology

## 2015-05-09 ENCOUNTER — Encounter (HOSPITAL_COMMUNITY): Payer: Self-pay | Admitting: *Deleted

## 2015-05-09 DIAGNOSIS — R011 Cardiac murmur, unspecified: Secondary | ICD-10-CM | POA: Diagnosis not present

## 2015-05-09 DIAGNOSIS — Z3A38 38 weeks gestation of pregnancy: Secondary | ICD-10-CM | POA: Diagnosis not present

## 2015-05-09 DIAGNOSIS — G43109 Migraine with aura, not intractable, without status migrainosus: Secondary | ICD-10-CM | POA: Diagnosis not present

## 2015-05-09 DIAGNOSIS — O26893 Other specified pregnancy related conditions, third trimester: Secondary | ICD-10-CM | POA: Diagnosis not present

## 2015-05-09 DIAGNOSIS — R51 Headache: Secondary | ICD-10-CM | POA: Diagnosis present

## 2015-05-09 HISTORY — DX: Headache: R51

## 2015-05-09 HISTORY — DX: Headache, unspecified: R51.9

## 2015-05-09 MED ORDER — DEXAMETHASONE SODIUM PHOSPHATE 10 MG/ML IJ SOLN
10.0000 mg | Freq: Once | INTRAMUSCULAR | Status: AC
  Administered 2015-05-09: 10 mg via INTRAVENOUS
  Filled 2015-05-09: qty 1

## 2015-05-09 MED ORDER — DIPHENHYDRAMINE HCL 50 MG/ML IJ SOLN
25.0000 mg | Freq: Once | INTRAMUSCULAR | Status: AC
  Administered 2015-05-09: 25 mg via INTRAVENOUS
  Filled 2015-05-09: qty 1

## 2015-05-09 MED ORDER — SODIUM CHLORIDE 0.9 % IV SOLN
INTRAVENOUS | Status: DC
Start: 2015-05-09 — End: 2015-05-09
  Administered 2015-05-09: 14:00:00 via INTRAVENOUS

## 2015-05-09 MED ORDER — METOCLOPRAMIDE HCL 5 MG/ML IJ SOLN
10.0000 mg | Freq: Once | INTRAMUSCULAR | Status: AC
  Administered 2015-05-09: 10 mg via INTRAVENOUS
  Filled 2015-05-09: qty 2

## 2015-05-09 MED ORDER — BUTALBITAL-APAP-CAFFEINE 50-325-40 MG PO TABS: 1.0000 | ORAL_TABLET | Freq: Four times a day (QID) | ORAL | Status: DC | PRN

## 2015-05-09 NOTE — MAU Provider Note (Signed)
History     CSN: [id number]  Arrival date and time: 05/09/15 1245   First Provider Initiated Contact with Patient 05/09/15 1311         Chief Complaint  Patient presents with  . Headache  . Aphasia   HPI Comments: Jo [name] is a 25 y.o. G2P1001 at [redacted]w[redacted]d who presents with headache, blurry vision, & slurred speech. Symptoms began at 1030 this morning. Reports history of migraines & states that her auras during pregnancy include blurry vision, difficulty speaking, & numbness. Last migraine was 3 years ago. Denies ob complaints.   Migraine  This is a recurrent problem. The current episode started today. The problem has been unchanged. The pain is located in the frontal region. The pain does not radiate. The pain quality is similar to prior headaches. The quality of the pain is described as aching and sharp. The pain is at a severity of 7/10. Associated symptoms include blurred vision, photophobia and tinnitus (resolved just PTA). Pertinent negatives include no abdominal pain, abnormal behavior, dizziness, eye pain, fever, hearing loss, nausea, neck pain, numbness, phonophobia, seizures, sinus pressure, tingling, vomiting or weakness. Associated symptoms comments: Reports difficulty speaking & slurred speech. Exacerbated by: pregnancy. She has tried nothing for the symptoms. Her past medical history is significant for migraine headaches and migraines in the family. There is no history of hypertension or recent head traumas.    OB History    Gravida Para Term Preterm AB TAB SAB Ectopic Multiple Living   Past Medical History  Diagnosis Date  . Heart murmur     bicuspid aorta valve  . Headache     Past Surgical History  Procedure Laterality Date  . Aortic valve surgery  1994    Stated in Prenatal Record that she had surgery as baby to repair valve, but told our MD  the valve corrected itself  . Mouth surgery      Family History  Problem Relation Age of Onset   . Cancer Mother     cervical  . Miscarriages / Stillbirths Mother     3 miscarriages  . Arthritis Mother   . Depression Mother   . Learning disabilities Brother     ADHD  . Asthma Brother   . Birth defects Brother   . Cancer Maternal Aunt     cervical  . Mental illness Maternal Aunt   . Drug abuse Maternal Aunt   . Diabetes Paternal Aunt   . Drug abuse Paternal Aunt   . Diabetes Maternal Grandmother   . Heart disease Maternal Grandmother   . Hypertension Maternal Grandmother   . Heart disease Maternal Grandfather   . Alcohol abuse Maternal Grandfather   . Alcohol abuse Father   . Drug abuse Father   . Mental illness Father   . Heart disease Paternal Grandmother   . Anesthesia problems Neg Hx   . Hypotension Neg Hx   . Malignant hyperthermia Neg Hx   . Pseudochol deficiency Neg Hx     Social History  Substance Use Topics  . Smoking status: Never Smoker   . Smokeless tobacco: Never Used  . Alcohol Use: No    Allergies:  Allergies  Allergen Reactions  . Lemon Flavor Nausea And Vomiting  . Orange Flavor Nausea And Vomiting  . Aspirin Other (See Comments)    Pt states that the drug does not "break-down"  in her body.  Marland Kitchen  Penicillins Other (See Comments)    Prescriptions prior to admission  Medication Sig Dispense Refill Last Dose  . Prenatal Vit-Fe Fumarate-FA (PRENATAL MULTIVITAMIN) TABS tablet Take 1 tablet by mouth daily at 12 noon.   05/08/2015 at Unknown time    Review of Systems  Constitutional: Negative.  Negative for fever.  HENT: Positive for tinnitus (resolved just PTA). Negative for congestion, hearing loss and sinus pressure.   Eyes: Positive for blurred vision and photophobia. Negative for pain.  Respiratory: Negative.   Cardiovascular: Negative.   Gastrointestinal: Negative.  Negative for nausea, vomiting and abdominal pain.  Genitourinary: Negative.   Musculoskeletal: Negative for neck pain.  Neurological: Positive for speech change and  headaches. Negative for dizziness, tingling, focal weakness, seizures, weakness and numbness.   Physical Exam   Blood pressure 123/74, pulse 84, temperature 98.3 F (36.8 C), temperature source Oral, resp. rate 18, last menstrual period 08/11/2014, SpO2 99 %.  Physical Exam  Nursing note and vitals reviewed. Constitutional: She is oriented to person, place, and time. She appears well-developed and well-nourished. No distress.  Eyes: Conjunctivae are normal. Pupils are equal, round, and reactive to light. Right eye exhibits no discharge. Left eye exhibits no discharge. No scleral icterus.  Neck: Normal range of motion.  Cardiovascular: Normal rate, regular rhythm and normal heart sounds.   No murmur heard. Respiratory: Effort normal and breath sounds normal. No respiratory distress. She has no wheezes.  Neurological: She is alert and oriented to person, place, and time. She has normal strength and normal reflexes. No cranial nerve deficit.  Pt speaking clearly. No slurring of speech noted.   Skin: Skin is warm and dry. She is not diaphoretic.  Psychiatric: She has a normal mood and affect. Her behavior is normal. Judgment and thought content normal.   Fetal Tracing:  Baseline: 130 Variability: moderate Accelerations: 15x15 Decelerations: none  Toco: UI   MAU Course  Procedures No results found for this or any previous visit (from the past 24 hour(s)).  MDM Neuro exam & VS normal Reactive tracing Pt reports hx of migraines with this aura during pregnancy. Per review of records had workup in 2012 for similar symptoms with negative head CT. Pt has never gone to neuro & reports last migraine was 3 years ago.  1342- S/w Dr. Dareen Piano. Ok to give headache cocktail & d/c home if symptoms improve IV fluids with reglan, benadryl, & decadron given.  Pt reports complete resolution of headache, blurry vision, and speech changes.   Assessment and Plan  A: 1. Migraine with aura and  without status migrainosus, not intractable     P: Discharge home Discussed reasons to return Keep f/u with ob - may consider neuro consult for migraine eval & future tx  Judeth Horn 05/09/2015, 3:11 PM

## 2015-05-09 NOTE — MAU Note (Signed)
Pt C/O HA since 1030, then started having slurred speech, has tunnel vision, is unable to drive.  Denies hypertension.  Hx of migraine HA's, becomes worse with pregnancy.  Denies uc's, bleeding or LOF.

## 2015-05-09 NOTE — Discharge Instructions (Signed)

## 2015-05-21 ENCOUNTER — Inpatient Hospital Stay (HOSPITAL_COMMUNITY)
Admission: AD | Admit: 2015-05-21 | Discharge: 2015-05-23 | DRG: 775 | Disposition: A | Discharge status: Home / Self Care | Payer: Medicaid Other | Source: Ambulatory Visit | Attending: Obstetrics and Gynecology | Admitting: Obstetrics and Gynecology

## 2015-05-21 ENCOUNTER — Encounter (HOSPITAL_COMMUNITY): Payer: Self-pay

## 2015-05-21 DIAGNOSIS — O480 Post-term pregnancy: Principal | ICD-10-CM | POA: Diagnosis present

## 2015-05-21 DIAGNOSIS — Z825 Family history of asthma and other chronic lower respiratory diseases: Secondary | ICD-10-CM | POA: Diagnosis not present

## 2015-05-21 DIAGNOSIS — Z6791 Unspecified blood type, Rh negative: Secondary | ICD-10-CM

## 2015-05-21 DIAGNOSIS — Z8249 Family history of ischemic heart disease and other diseases of the circulatory system: Secondary | ICD-10-CM

## 2015-05-21 DIAGNOSIS — O99214 Obesity complicating childbirth: Secondary | ICD-10-CM | POA: Diagnosis present

## 2015-05-21 DIAGNOSIS — Z6841 Body Mass Index (BMI) 40.0 and over, adult: Secondary | ICD-10-CM | POA: Diagnosis not present

## 2015-05-21 DIAGNOSIS — Z833 Family history of diabetes mellitus: Secondary | ICD-10-CM | POA: Diagnosis not present

## 2015-05-21 DIAGNOSIS — Z818 Family history of other mental and behavioral disorders: Secondary | ICD-10-CM | POA: Diagnosis not present

## 2015-05-21 DIAGNOSIS — Z3A40 40 weeks gestation of pregnancy: Secondary | ICD-10-CM

## 2015-05-21 DIAGNOSIS — Z811 Family history of alcohol abuse and dependence: Secondary | ICD-10-CM

## 2015-05-21 DIAGNOSIS — Z3490 Encounter for supervision of normal pregnancy, unspecified, unspecified trimester: Secondary | ICD-10-CM

## 2015-05-21 DIAGNOSIS — O26893 Other specified pregnancy related conditions, third trimester: Secondary | ICD-10-CM | POA: Diagnosis present

## 2015-05-21 LAB — COMPREHENSIVE METABOLIC PANEL
ALBUMIN: 3.1 g/dL — AB (ref 3.5–5.0)
ALK PHOS: 109 U/L (ref 38–126)
ALT: 10 U/L — ABNORMAL LOW (ref 14–54)
ANION GAP: 7 (ref 5–15)
AST: 24 U/L (ref 15–41)
BILIRUBIN TOTAL: 0.7 mg/dL (ref 0.3–1.2)
BUN: 7 mg/dL (ref 6–20)
CALCIUM: 8.8 mg/dL — AB (ref 8.9–10.3)
CO2: 20 mmol/L — ABNORMAL LOW (ref 22–32)
Chloride: 108 mmol/L (ref 101–111)
Creatinine, Ser: 0.49 mg/dL (ref 0.44–1.00)
GFR calc Af Amer: >60 mL/min (ref >60)
GFR calc non Af Amer: >60 mL/min (ref >60)
GLUCOSE: 124 mg/dL — AB (ref 65–99)
Potassium: 4 mmol/L (ref 3.5–5.1)
Sodium: 135 mmol/L (ref 135–145)
TOTAL PROTEIN: 6.2 g/dL — AB (ref 6.5–8.1)

## 2015-05-21 LAB — TYPE AND SCREEN
ABO/RH(D): AB NEG
Antibody Screen: NEGATIVE

## 2015-05-21 LAB — CBC
HCT: 33.2 % — ABNORMAL LOW (ref 36.0–46.0)
HEMOGLOBIN: 11.3 g/dL — AB (ref 12.0–15.0)
MCH: 27.3 pg (ref 26.0–34.0)
MCHC: 34 g/dL (ref 30.0–36.0)
MCV: 80.2 fL (ref 78.0–100.0)
PLATELETS: 207 10*3/uL (ref 150–400)
RBC: 4.14 MIL/uL (ref 3.87–5.11)
RDW: 13.8 % (ref 11.5–15.5)
WBC: 10.5 10*3/uL (ref 4.0–10.5)

## 2015-05-21 LAB — LACTATE DEHYDROGENASE: LDH: 189 U/L (ref 98–192)

## 2015-05-21 LAB — URIC ACID: Uric Acid, Serum: 3.6 mg/dL (ref 2.3–6.6)

## 2015-05-21 MED ORDER — OXYTOCIN BOLUS FROM INFUSION
500.0000 mL | INTRAVENOUS | Status: DC
Start: 2015-05-21 — End: 2015-05-22

## 2015-05-21 MED ORDER — LIDOCAINE HCL (PF) 1 % IJ SOLN
30.0000 mL | INTRAMUSCULAR | Status: DC | PRN
  Administered 2015-05-22: 30 mL via SUBCUTANEOUS
  Filled 2015-05-21: qty 30

## 2015-05-21 MED ORDER — OXYTOCIN 10 UNIT/ML IJ SOLN: 2.5000 [IU]/h | INTRAMUSCULAR | Status: DC

## 2015-05-21 MED ORDER — LACTATED RINGERS IV SOLN
500.0000 mL | INTRAVENOUS | Status: DC | PRN
  Administered 2015-05-22: 500 mL via INTRAVENOUS

## 2015-05-21 MED ORDER — TERBUTALINE SULFATE 1 MG/ML IJ SOLN
0.2500 mg | Freq: Once | INTRAMUSCULAR | Status: DC | PRN
  Filled 2015-05-21: qty 1

## 2015-05-21 MED ORDER — CITRIC ACID-SODIUM CITRATE 334-500 MG/5ML PO SOLN: 30.0000 mL | ORAL | Status: DC | PRN

## 2015-05-21 MED ORDER — ACETAMINOPHEN 325 MG PO TABS: 650.0000 mg | ORAL_TABLET | ORAL | Status: DC | PRN

## 2015-05-21 MED ORDER — MISOPROSTOL 25 MCG QUARTER TABLET
25.0000 ug | ORAL_TABLET | ORAL | Status: DC | PRN
  Administered 2015-05-21: 25 ug via VAGINAL
  Filled 2015-05-21 (×2): qty 0.25
  Filled 2015-05-21: qty 1

## 2015-05-21 MED ORDER — ZOLPIDEM TARTRATE 5 MG PO TABS: 5.0000 mg | ORAL_TABLET | Freq: Every evening | ORAL | Status: DC | PRN

## 2015-05-21 MED ORDER — ONDANSETRON HCL 4 MG/2ML IJ SOLN
4.0000 mg | Freq: Four times a day (QID) | INTRAMUSCULAR | Status: DC | PRN
  Administered 2015-05-22: 4 mg via INTRAVENOUS
  Filled 2015-05-21: qty 2

## 2015-05-21 MED ORDER — OXYCODONE-ACETAMINOPHEN 5-325 MG PO TABS: 1.0000 | ORAL_TABLET | ORAL | Status: DC | PRN

## 2015-05-21 MED ORDER — OXYCODONE-ACETAMINOPHEN 5-325 MG PO TABS: 2.0000 | ORAL_TABLET | ORAL | Status: DC | PRN

## 2015-05-21 MED ORDER — LACTATED RINGERS IV SOLN
INTRAVENOUS | Status: DC
  Administered 2015-05-21 – 2015-05-22 (×4): via INTRAVENOUS

## 2015-05-21 MED ORDER — BUTORPHANOL TARTRATE 1 MG/ML IJ SOLN
1.0000 mg | INTRAMUSCULAR | Status: DC | PRN
  Administered 2015-05-22: 1 mg via INTRAVENOUS
  Filled 2015-05-21 (×2): qty 1

## 2015-05-21 NOTE — H&P (Signed)
Jo HousemanBrooke [name] is a 25 y.o. female presenting for IOL   25 yo G2P1001 @ 40+3 presents for IOL for post-dates and elevated BPs in the office earlier today. The patient was seen for a post-term NST today and was noted to have elevated BPs 140/90 with 1 + protein. Last week she was also noted to have 2+ protein in her urine and a 24 hr urine was 284. Given postterm pregnancy and elevated BPs the patient was admitted for PD IOL History OB History    Gravida Para Term Preterm AB TAB SAB Ectopic Multiple Living   2 1 1       1      Past Medical History  Diagnosis Date  . Heart murmur     bicuspid aorta valve  . Headache    Past Surgical History  Procedure Laterality Date  . Aortic valve surgery  1994    Stated in Prenatal Record that she had surgery as baby to repair valve, but told our MD  the valve corrected itself  . Mouth surgery     Family History: family history includes Alcohol abuse in her father and maternal grandfather; Arthritis in her mother; Asthma in her brother; Birth defects in her brother; Cancer in her maternal aunt and mother; Depression in her mother; Diabetes in her maternal grandmother and paternal aunt; Drug abuse in her father, maternal aunt, and paternal aunt; Heart disease in her maternal grandfather, maternal grandmother, and paternal grandmother; Hypertension in her maternal grandmother; Learning disabilities in her brother; Mental illness in her father and maternal aunt; Miscarriages / Stillbirths in her mother. There is no history of Anesthesia problems, Hypotension, Malignant hyperthermia, or Pseudochol deficiency. Social History:  reports that she has never smoked. She has never used smokeless tobacco. She reports that she does not drink alcohol or use illicit drugs.   Prenatal Transfer Tool  Maternal Diabetes: No Genetic Screening: Normal Maternal Ultrasounds/Referrals: Normal Fetal Ultrasounds or other Referrals:  None Maternal Substance Abuse:   No Significant Maternal Medications:  None Significant Maternal Lab Results:  None Other Comments:  None  ROS: as above    Blood pressure 115/69, pulse 92, resp. rate 18, height 4\' 11"  (1.499 m), weight 96.616 kg (213 lb), last menstrual period 08/11/2014. Exam Physical Exam  Prenatal labs: ABO, Rh: AB/Negative/-- (08/25 0000) Antibody: Negative (08/25 0000) Rubella: Immune (08/25 0000) RPR: Nonreactive (08/25 0000)  HBsAg: Negative (08/25 0000)  HIV: Non-reactive (08/25 0000)  GBS: Negative (02/08 0000)   Assessment/Plan: 1) Admit 2) CMP/LDH/uric acid 3) Misoprostal 25mcg PV Q 4 hrs   Jo [name] H. 05/21/2015, 9:02 PM

## 2015-05-22 ENCOUNTER — Inpatient Hospital Stay (HOSPITAL_COMMUNITY): Payer: Medicaid Other | Admitting: Anesthesiology

## 2015-05-22 ENCOUNTER — Encounter (HOSPITAL_COMMUNITY): Payer: Self-pay | Admitting: *Deleted

## 2015-05-22 DIAGNOSIS — Z3490 Encounter for supervision of normal pregnancy, unspecified, unspecified trimester: Secondary | ICD-10-CM

## 2015-05-22 LAB — CBC
HCT: 34 % — ABNORMAL LOW (ref 36.0–46.0)
HCT: 32.9 % — ABNORMAL LOW (ref 36.0–46.0)
Hemoglobin: 11.2 g/dL — ABNORMAL LOW (ref 12.0–15.0)
Hemoglobin: 10.9 g/dL — ABNORMAL LOW (ref 12.0–15.0)
MCH: 26.5 pg (ref 26.0–34.0)
MCH: 26.6 pg (ref 26.0–34.0)
MCHC: 32.9 g/dL (ref 30.0–36.0)
MCHC: 33.1 g/dL (ref 30.0–36.0)
MCV: 80.6 fL (ref 78.0–100.0)
MCV: 80.2 fL (ref 78.0–100.0)
PLATELETS: 165 10*3/uL (ref 150–400)
Platelets: 157 10*3/uL (ref 150–400)
RBC: 4.22 MIL/uL (ref 3.87–5.11)
RBC: 4.1 MIL/uL (ref 3.87–5.11)
RDW: 13.9 % (ref 11.5–15.5)
RDW: 13.8 % (ref 11.5–15.5)
WBC: 13.5 10*3/uL — AB (ref 4.0–10.5)
WBC: 19.8 10*3/uL — AB (ref 4.0–10.5)

## 2015-05-22 LAB — RPR: RPR: NONREACTIVE

## 2015-05-22 MED ORDER — WITCH HAZEL-GLYCERIN EX PADS: 1.0000 "application " | MEDICATED_PAD | CUTANEOUS | Status: DC | PRN

## 2015-05-22 MED ORDER — SENNOSIDES-DOCUSATE SODIUM 8.6-50 MG PO TABS
2.0000 | ORAL_TABLET | ORAL | Status: DC
  Administered 2015-05-22: 2 via ORAL
  Filled 2015-05-22: qty 2

## 2015-05-22 MED ORDER — LIDOCAINE HCL (PF) 1 % IJ SOLN
INTRAMUSCULAR | Status: DC | PRN
  Administered 2015-05-22 (×2): 5 mL

## 2015-05-22 MED ORDER — SIMETHICONE 80 MG PO CHEW: 80.0000 mg | CHEWABLE_TABLET | ORAL | Status: DC | PRN

## 2015-05-22 MED ORDER — FENTANYL 2.5 MCG/ML BUPIVACAINE 1/10 % EPIDURAL INFUSION (WH - ANES)
14.0000 mL/h | INTRAMUSCULAR | Status: DC | PRN
  Administered 2015-05-22: 14 mL/h via EPIDURAL
  Administered 2015-05-22: 12 mL/h via EPIDURAL
  Filled 2015-05-22 (×2): qty 125

## 2015-05-22 MED ORDER — ACETAMINOPHEN 325 MG PO TABS
650.0000 mg | ORAL_TABLET | ORAL | Status: DC | PRN
  Administered 2015-05-22: 650 mg via ORAL
  Filled 2015-05-22: qty 2

## 2015-05-22 MED ORDER — ONDANSETRON HCL 4 MG PO TABS: 4.0000 mg | ORAL_TABLET | ORAL | Status: DC | PRN

## 2015-05-22 MED ORDER — TETANUS-DIPHTH-ACELL PERTUSSIS 5-2.5-18.5 LF-MCG/0.5 IM SUSP
0.5000 mL | Freq: Once | INTRAMUSCULAR | Status: AC
  Administered 2015-05-23: 0.5 mL via INTRAMUSCULAR
  Filled 2015-05-22: qty 0.5

## 2015-05-22 MED ORDER — PHENYLEPHRINE 40 MCG/ML (10ML) SYRINGE FOR IV PUSH (FOR BLOOD PRESSURE SUPPORT)
80.0000 ug | PREFILLED_SYRINGE | INTRAVENOUS | Status: DC | PRN
  Filled 2015-05-22: qty 2

## 2015-05-22 MED ORDER — ZOLPIDEM TARTRATE 5 MG PO TABS: 5.0000 mg | ORAL_TABLET | Freq: Every evening | ORAL | Status: DC | PRN

## 2015-05-22 MED ORDER — MEASLES, MUMPS & RUBELLA VAC ~~LOC~~ INJ
0.5000 mL | INJECTION | Freq: Once | SUBCUTANEOUS | Status: DC
  Filled 2015-05-22: qty 0.5

## 2015-05-22 MED ORDER — DIPHENHYDRAMINE HCL 50 MG/ML IJ SOLN
12.5000 mg | INTRAMUSCULAR | Status: DC | PRN
Start: 2015-05-22 — End: 2015-05-22

## 2015-05-22 MED ORDER — BENZOCAINE-MENTHOL 20-0.5 % EX AERO
1.0000 "application " | INHALATION_SPRAY | CUTANEOUS | Status: DC | PRN
  Administered 2015-05-22: 1 via TOPICAL
  Filled 2015-05-22: qty 56

## 2015-05-22 MED ORDER — DIBUCAINE 1 % RE OINT: 1.0000 "application " | TOPICAL_OINTMENT | RECTAL | Status: DC | PRN

## 2015-05-22 MED ORDER — LACTATED RINGERS IV SOLN: 500.0000 mL | Freq: Once | INTRAVENOUS | Status: DC

## 2015-05-22 MED ORDER — ONDANSETRON HCL 4 MG/2ML IJ SOLN: 4.0000 mg | INTRAMUSCULAR | Status: DC | PRN

## 2015-05-22 MED ORDER — PHENYLEPHRINE 40 MCG/ML (10ML) SYRINGE FOR IV PUSH (FOR BLOOD PRESSURE SUPPORT)
80.0000 ug | PREFILLED_SYRINGE | INTRAVENOUS | Status: DC | PRN
  Filled 2015-05-22: qty 20
  Filled 2015-05-22: qty 2

## 2015-05-22 MED ORDER — EPHEDRINE 5 MG/ML INJ
10.0000 mg | INTRAVENOUS | Status: DC | PRN
  Filled 2015-05-22: qty 2

## 2015-05-22 MED ORDER — BUPIVACAINE HCL (PF) 0.25 % IJ SOLN
INTRAMUSCULAR | Status: DC | PRN
  Administered 2015-05-22: 6 mL via EPIDURAL

## 2015-05-22 MED ORDER — OXYTOCIN 10 UNIT/ML IJ SOLN
1.0000 m[IU]/min | INTRAVENOUS | Status: DC
  Administered 2015-05-22: 1 m[IU]/min via INTRAVENOUS
  Filled 2015-05-22: qty 10

## 2015-05-22 NOTE — Anesthesia Preprocedure Evaluation (Signed)
Anesthesia Evaluation  Patient identified by MRN, date of birth, ID band Patient awake    Reviewed: Allergy & Precautions, H&P , NPO status , Patient's Chart, lab work & pertinent test results  History of Anesthesia Complications Negative for: history of anesthetic complications  Airway Mallampati: II  TM Distance: >3 FB Neck ROM: full    Dental no notable dental hx. (+) Teeth Intact   Pulmonary neg pulmonary ROS,    Pulmonary exam normal breath sounds clear to auscultation       Cardiovascular negative cardio ROS Normal cardiovascular exam Rhythm:regular Rate:Normal     Neuro/Psych negative neurological ROS  negative psych ROS   GI/Hepatic negative GI ROS, Neg liver ROS,   Endo/Other  Morbid obesity  Renal/GU negative Renal ROS  negative genitourinary   Musculoskeletal   Abdominal   Peds  Hematology negative hematology ROS (+)   Anesthesia Other Findings   Reproductive/Obstetrics (+) Pregnancy                             Anesthesia Physical Anesthesia Plan  ASA: II  Anesthesia Plan: Epidural   Post-op Pain Management:    Induction:   Airway Management Planned:   Additional Equipment:   Intra-op Plan:   Post-operative Plan:   Informed Consent: I have reviewed the patients History and Physical, chart, labs and discussed the procedure including the risks, benefits and alternatives for the proposed anesthesia with the patient or authorized representative who has indicated his/her understanding and acceptance.     Plan Discussed with:   Anesthesia Plan Comments:         Anesthesia Quick Evaluation  

## 2015-05-22 NOTE — Anesthesia Postprocedure Evaluation (Signed)
Anesthesia Post Note  Patient: Jo [name]  Procedure(s) Performed: * No procedures listed *  Patient location during evaluation: Mother Baby Anesthesia Type: Epidural Level of consciousness: awake and alert Pain management: pain level controlled Vital Signs Assessment: post-procedure vital signs reviewed and stable Respiratory status: spontaneous breathing Cardiovascular status: stable Postop Assessment: no headache, no backache, epidural receding and patient able to bend at knees (ambulating without problem;states epidural ineffective;MDA offered to replace but pt declined) Anesthetic complications: no    Last Vitals:  Filed Vitals:   05/22/15 1330 05/22/15 1434  BP: 119/62 127/65  Pulse: 88 93  Temp: 36.8 C 36.9 C  Resp: 18 20    Last Pain:  Filed Vitals:   05/22/15 1437  PainSc: 0-No pain                 Edison PaceWILKERSON,[name]

## 2015-05-22 NOTE — Anesthesia Procedure Notes (Signed)
Epidural Patient location during procedure: OB  Staffing Anesthesiologist: Phillips GroutARIGNAN, Camisha Srey Performed by: anesthesiologist   Preanesthetic Checklist Completed: patient identified, site marked, surgical consent, pre-op evaluation, timeout performed, IV checked, risks and benefits discussed and monitors and equipment checked  Epidural Patient position: sitting Prep: DuraPrep Patient monitoring: heart rate, continuous pulse ox and blood pressure Approach: midline Location: L3-L4 Injection technique: LOR saline  Needle:  Needle type: Tuohy  Needle gauge: 17 G Needle length: 9 cm and 9 Needle insertion depth: 7 cm Catheter type: closed end flexible Catheter size: 20 Guage Catheter at skin depth: 10 cm Test dose: negative  Assessment Events: blood not aspirated, injection not painful, no injection resistance, negative IV test and no paresthesia  Additional Notes Patient identified. Risks/Benefits/Options discussed with patient including but not limited to bleeding, infection, nerve damage, paralysis, failed block, incomplete pain control, headache, blood pressure changes, nausea, vomiting, reactions to medication both or allergic, itching and postpartum back pain. Confirmed with bedside nurse the patient's most recent platelet count. Confirmed with patient that they are not currently taking any anticoagulation, have any bleeding history or any family history of bleeding disorders. Patient expressed understanding and wished to proceed. All questions were answered. Sterile technique was used throughout the entire procedure. Please see nursing notes for vital signs. Test dose was given through epidural needle and negative prior to continuing to dose epidural or start infusion. Warning signs of high block given to the patient including shortness of breath, tingling/numbness in hands, complete motor block, or any concerning symptoms with instructions to call for help. Patient was given  instructions on fall risk and not to get out of bed. All questions and concerns addressed with instructions to call with any issues.

## 2015-05-22 NOTE — Consults (Signed)
  Anesthesia Pain Consult Note  Patient: [name], 25 y.o., female  Consult Requested by: Waynard ReedsKendra Ross, MD  Reason for Consult: CRNA Pain Management Rounds  Level of Consciousness: alert  Pain: 7 /10 Pain goal: 7  Last Vitals:  Filed Vitals:   05/22/15 0631 05/22/15 0701  BP: 116/78 111/54  Pulse:  93  Temp:    Resp: 18 18    Plan: Epidural infusion for pain control.   Khoury Siemon 05/22/2015

## 2015-05-22 NOTE — Lactation Note (Signed)
This note was copied from a baby's chart. Lactation Consultation Note  Patient Name: Jo [name] ZOXWR'UToday's Date: 05/22/2015 Reason for consult: Initial assessment   Initial consult with experienced BF mom of 5 hour old infant. Mom reports he last fed at 14:30 for 10 minutes.  She reports the infant fed well and and she denies nipple tenderness.  Infant currently asleep in crib. Enc mom to feed infant 8-12 x in 24 hours at first feeding cues. Discussed awakening techniques with mom Mom reports she has hand expressed before, we did not hand express at this time.  Mom is aware there is a spoon in the room and that if infant does not feed to hand express and spoon feed infant. Enc mom to call with questions/concerns prn LC Brochure and BF Resources Handout given, informed mom of OP Services, BF Support Groups and LC phone #.  Mom has feeding Log, she plans to use [employer] App on her phone.     Maternal Data Formula Feeding for Exclusion: No Does the patient have breastfeeding experience prior to this delivery?: Yes  Feeding Feeding Type: Breast Fed  LATCH Score/Interventions Latch: Repeated attempts needed to sustain latch, nipple held in mouth throughout feeding, stimulation needed to elicit sucking reflex. Intervention(s): Adjust position;Assist with latch;Breast massage  Audible Swallowing: A few with stimulation Intervention(s): Skin to skin;Hand expression  Type of Nipple: Everted at rest and after stimulation  Comfort (Breast/Nipple): Soft / non-tender     Hold (Positioning): Assistance needed to correctly position infant at breast and maintain latch. Intervention(s): Breastfeeding basics reviewed;Support Pillows;Position options;Skin to skin  LATCH Score: 7  Lactation Tools Discussed/Used     Consult Status Consult Status: Follow-up Date: 05/23/15 Follow-up type: In-patient    Jo FloodSharon S Vernee [name] 05/22/2015, 4:22 PM

## 2015-05-22 NOTE — Plan of Care (Signed)
Dr. Acey Lavarignan at bedside offered to redo epidural pt refuses at this time. States " I will just deal with it".

## 2015-05-23 LAB — CBC
HCT: 28.6 % — ABNORMAL LOW (ref 36.0–46.0)
Hemoglobin: 9.5 g/dL — ABNORMAL LOW (ref 12.0–15.0)
MCH: 26.5 pg (ref 26.0–34.0)
MCHC: 33.2 g/dL (ref 30.0–36.0)
MCV: 79.9 fL (ref 78.0–100.0)
PLATELETS: 144 10*3/uL — AB (ref 150–400)
RBC: 3.58 MIL/uL — ABNORMAL LOW (ref 3.87–5.11)
RDW: 14 % (ref 11.5–15.5)
WBC: 12.4 10*3/uL — AB (ref 4.0–10.5)

## 2015-05-23 MED ORDER — RHO D IMMUNE GLOBULIN 1500 UNIT/2ML IJ SOSY
300.0000 ug | PREFILLED_SYRINGE | Freq: Once | INTRAMUSCULAR | Status: AC
  Administered 2015-05-23: 300 ug via INTRAMUSCULAR
  Filled 2015-05-23: qty 2

## 2015-05-23 MED ORDER — IBUPROFEN 800 MG PO TABS: 800.0000 mg | ORAL_TABLET | Freq: Four times a day (QID) | ORAL | Status: DC | PRN

## 2015-05-23 NOTE — Discharge Summary (Signed)
Obstetric Discharge Summary Reason for Admission: induction of labor Prenatal Procedures: none Intrapartum Procedures: spontaneous vaginal delivery Postpartum Procedures: Rho(D) Ig Complications-Operative and Postpartum: 2 degree perineal laceration HEMOGLOBIN  Date Value Ref Range Status  05/23/2015 9.5* 12.0 - 15.0 g/dL Final   HCT  Date Value Ref Range Status  05/23/2015 28.6* 36.0 - 46.0 % Final    Physical Exam:  General: alert, cooperative and appears stated age 69Lochia: appropriate Uterine Fundus: firm DVT Evaluation: No evidence of DVT seen on physical exam.  Discharge Diagnoses: Term Pregnancy-delivered  Discharge Information: Date: 05/23/2015 Activity: pelvic rest Diet: routine Medications: PNV and Ibuprofen  Declined percocet Condition: stable Instructions: refer to practice specific booklet Discharge to: home Follow-up Information    Follow up with Levi AlandANDERSON,MARK E, MD In 4 weeks.   Specialty:  Obstetrics and Gynecology   Contact information:   [address] [place] [phone]       Newborn Data: Live born female  Birth Weight: 9 lb 4.2 oz (4200 g) APGAR: 1, 7  Home with mother.  Darus Hershman GEFFEL Adi Doro 05/23/2015, 9:21 AM

## 2015-05-23 NOTE — Progress Notes (Signed)
Patient is doing well.  She is ambulating, voiding, tolerating PO.  Pain control is good.  Lochia is appropriate  Filed Vitals:   05/22/15 1434 05/22/15 1825 05/22/15 2356 05/23/15 0607  BP: 127/65 119/82 132/62 117/60  Pulse: 93 90 87 96  Temp: 98.5 F (36.9 C) 98.7 F (37.1 C) 98.2 F (36.8 C) 98.2 F (36.8 C)  TempSrc: Oral Oral Oral Oral  Resp: 20 18 18 18   Height:      Weight:      SpO2: 97% 99%      NAD Fundus firm Ext: 1+ edema b/l  Lab Results  Component Value Date   WBC 12.4* 05/23/2015   HGB 9.5* 05/23/2015   HCT 28.6* 05/23/2015   MCV 79.9 05/23/2015   PLT 144* 05/23/2015    --/--/AB NEG (03/13 2032)/RImmune  A/P 24 y.o. [id number] PPD#1 s/p TSVD. Routine care. Rh neg--baby Rh + -- rhogam w/u pending, to be administered prior to d/c   Meeting all goals--desires d/c today.    [facility] CenterDYANNA [name] [employer] Tim[employer] CompanyCLARK

## 2015-05-23 NOTE — Progress Notes (Signed)
UR chart review completed.  

## 2015-05-23 NOTE — Lactation Note (Signed)
This note was copied from a baby's chart. Lactation Consultation Note  Mother has been pumping in addition to breastfeeding and getting a few ml of colostrum. Mother states she has been having trouble getting baby to open wide.  Reviewed latching techniques and suggest she call if she wants further assistance. Reviewed engorgement care and monitoring voids/stools.   Patient Name: Jo MurphyUToday's Date: 05/23/2015 Reason for consult: Follow-up assessment   Maternal Data    Feeding Feeding Type: Breast Milk Length of feed: 20 min  LATCH Score/Interventions                      Lactation Tools Discussed/Used     Consult Status Consult Status: Follow-up Date: 05/24/15 Follow-up type: In-patient    Dahlia ByesBerkelhammer, Ruth [facility] LLCBoschen 05/23/2015, 8:18 AM

## 2015-05-24 LAB — RH IG WORKUP (INCLUDES ABO/RH)
ABO/RH(D): AB NEG
Fetal Screen: NEGATIVE
GESTATIONAL AGE(WKS): 40.4
Unit division: 0

## 2015-06-05 ENCOUNTER — Ambulatory Visit (HOSPITAL_COMMUNITY): Admission: RE | Admit: 2015-06-05 | Payer: Medicaid Other | Source: Ambulatory Visit

## 2015-06-07 ENCOUNTER — Ambulatory Visit (HOSPITAL_COMMUNITY)
Admission: RE | Admit: 2015-06-07 | Discharge: 2015-06-07 | Disposition: A | Discharge status: Home / Self Care | Payer: Medicaid Other | Source: Ambulatory Visit | Attending: Obstetrics and Gynecology | Admitting: Obstetrics and Gynecology

## 2015-06-07 NOTE — Lactation Note (Addendum)
Lactation Consult  Mother's reason for visit: weight issues w/ infant  Visit Type:  Feeding assessment  Appointment Notes:  @ week old , not gaining yet. Please call Jo Palaueresa Anderson NP , [facility] after consult with concerns.  Consult:  Initial Lactation Consultant:  Jo [name], Jo [name]  ________________________________________________________________________ Baby's Name: Jo [name] Date of Birth: 05/22/2013 Pediatrician:[facility] [phone]( # [phone], Fax# [phone])  Gender: female Gestational Age: 7327w4d (At Birth) Birth Weight: 9 lb 4.2 oz (4200 g) Weight at Discharge: Weight: 9 lb 0.8 oz (4105 g)[facility]   [facility] 1115 05/23/15 0010  Weight: 9 lb 4.2 oz (4200 g) 9 lb 0.8 oz (4105 g)   [facility]: 3/29 8-7 oz  Location:Pediatrician's office Weight today: 3844 g , 8-7.6 oz ( 3/30 )    mom reported weights after [facility] :  3/17 - 8-8 oz  3/21- 8-6 oz  3/28 - 8-6 oz  3/29 - 8-7 oz   ___________________________________________________________  Mother's Name: Jo [name] Type of delivery:  Vaginal  Breastfeeding Experience:  This is moms 2nd baby - per mom breast fed 1st baby 15 months without BF issues  Maternal Medical Conditions:  Pregnancy induced hypertension ( per mom had to be induced, no F/U B/P meds , just had to swelling which was better in a few days after going home)  Maternal Medications: PNV   ________________________________________________________________________  Breastfeeding History (Post Discharge)  Frequency of breastfeeding: per mom feeding every 2 hours or when baby needs to eat Duration of feeding:  Per mom 10 - 40 mins   Supplementing: per mom only supplementing baby with breast milk and is aware needing to supplement  with formula if breast milk not available.   Pumping: per  mom with  DEBP Medela - every 2 hours after baby feeds and getting  1-2 oz ( 30 -60 ml ) between both breast  Per mom I have several bags of  My milk in the freezer. ( mom did not indicate the volumes in each bag )  LC highly encouraged if she didn't have breast milk available to [facility] to her milk out of the freezer. ( highly important )    Infant Intake and Output Assessment  Voids:  10 -24 hrs.  Color:  Clear yellow ( large wet at the consult )  Stools:  7 - 24 hrs.  Color:  Yellow  ________________________________________________________________________  Maternal Breast Assessment - per mom her came in around 3 days post partum,  no issues with engorgement- Last pumped at 1230 both breast with EBM yield 30 ml  LC appt. At 1300.   Breast:  Filling  Nipple:  Erect Pain level:  0 Pain interventions:  Expressed breast milk  _______________________________________________________________________ Feeding Assessment/Evaluation - per mom baby last fed at 1130 for 20 mins , and supplemented 15 ml of her milk.   Initial feeding assessment: - baby awake , color pink , at 1st not showing feeding cues, but willing to latch.  LC noted a intermittent dry  And moist cough after feedings from the breast and bottle.  Per mom mentioned she was told that was due to mucous after birth.  Pink - red rash on scrotum and bottom ,without elevation . Per mom using some of her breast milk on it.   Infant's oral assessment:  Variance - LC exam  of oral cavity a gloved finger, short labial frenulum above the gum line, Upper lip stretches well with exam and when latched at the breast. High palate, chin slightly recessed, when baby sucking  On the LC's gloved finger after baby sucks a few times humps the back of the tongue, and pushes forward. No anterior tongue restriction noted.  Right lateralization was fair , and left lateralization minimal. Baby raises tongue slightly when  crying above the corners of his  Mouth but not normal elevation. Thin white coating on the tongue.   Jo [name] RN3. IBCLC , also examined baby with gloved finger and noted the same findings.   Important:  Evaluation of feeding this baby with a bottle:  When baby was supplemented after breast feeding with a medium based nipple - LC noted leakage on both sides of the mouth at the  corners and could visualize the sides of the tongue. Had a difficult time keeping a proper seal. The Bottle nipple was changed to a small - low flow nipple  And the baby had a better seal , no leakage noted.     Positioning:  Football Right breast  LATCH documentation:  Latch:  2 = Grasps breast easily, tongue down, lips flanged, rhythmical sucking.  Audible swallowing:  2 = Spontaneous and intermittent  Type of nipple:  2 = Everted at rest and after stimulation  Comfort (Breast/Nipple):  1 = Filling, red/small blisters or bruises, mild/mod discomfort  Hold (Positioning):  1 = Assistance needed to correctly position infant at breast and maintain latch  LATCH score:  8   Attached assessment:  Shallow @ 1st   Lips flanged:  Yes.    Lips untucked:  Yes.    Suck assessment:  Nutritive and Nonnutritive  Tools:  5 F SNS after the abby was noted to hang out - non - nutritive  Instructed on use and cleaning of tool:  Yes   Pre-feed weight:  3844 g , 8-7.6 oz  Wet changed and baby re-weighed  Pre-weight: 3824 g , 8-6.9 oz  Post-feed weight:  3844g, 8-7.6 oz  Amount transferred:  11 ml  Amount supplemented:  9 ml ( 60F SNS ) while feeding due to being non - nutritive  Total transfer at the breast : 20 ml   Additional Feeding Assessment -   Infant's oral assessment:  Variance ( please see above note or detail exam and findings )   Positioning:  Football Left breast  LATCH documentation:  Latch:  2 = Grasps breast easily, tongue down, lips flanged, rhythmical sucking.  Audible swallowing:  2 =  Spontaneous and intermittent  Type of nipple:  2 = Everted at rest and after stimulation  Comfort (Breast/Nipple):  1 = Filling, red/small blisters or bruises, mild/mod discomfort  Hold (Positioning):  2 = No assistance needed to correctly position infant at breast  LATCH score:  9   Attached assessment:  Deep  Lips flanged:  Yes.    Lips untucked:  Yes.    Suck assessment:  Nutritive and Nonnutritive ( more nutritive than the 1st breast )   Tools:  Syringe with 5 Fr feeding tube ( had mom inserted after the baby latched and she did well )  Instructed on use and cleaning of tool:  Yes.    Pre-feed weight:  3844 g , 8-7.6 oz  Post-feed weight:  3868 g , 8-8.5 oz  Amount transferred: 10ml  ( from mom )  Amount supplemented:  12ml  ( 53F SNS )  Total @ this latch: 22 ml    Total amount pumped post feed: Total R/L combined after baby fed - 15 ml  Total amount at the feedings ( breast without supplemented added in ) = 36 ml  Need to keep in mind hard to evaluate the milk volume due to mom feeding 1 1/2 hours before consult and  And pumping 1/2 hour before consult with 30 ml EBM yield.   Total amount transferred:  21 ml  Total supplement given:  21 ( 53F SNS )  Total supplement from a bottle: 21 ml  Total volume  Baby received at this consult feeding:  63 ml ( tolerated well)   Lactation Impression: Tongue mobility issues has caused issues with milk transfer ( not uncommon the see poor weight gain )  Difficult to evaluate mom true milk supply as explained above.  Baby improved with being more nutritive  At the breast with an SNS. Especially on the 2nd breast.  Mom does very well latching the baby and adding the SNS.  LC highly recommends baby is assessed by and oral specialist whom will address the posterior frenulum issue.  Mom was receptive and given the resource for a Oral specialist in [place], [place] whom accepts Medicaid.  This placed a call to Jo Palau, NP after consult on her  cell phone waiting return call.  SEE LC Plan below.   LC plan of Care:  Feedings: 2/1/2 - 3 hours ( wake Ryan up  Skin to skin feeding until he is gaining well and can stay awake for feedings  Option #1 - Ryan  awake , latch 1st STS, apply 5 F feeding tube as shown  After latch.  Feed 20 mins , supplement after wards with EBM or formula in bottle  Post pump both breast for 10 -20 mins.  Option #2 - Steps for latching - and instead of using the 5 F feeding tube 0 Double SNS as shown  Option #3 - If Alycia Rossetti is to sluggish to latch - feed 30 ml of EBM or formula , then latch, supplement afterwards if needed and post pump.  Post pumping both breast with enhance your milk supply. Important after at least 5-6 feedings a day 10 -20 mins Mom asked about taking Mother [employer] Herbs for milk supply - LC highly recommended to take has directed.    Addendum to this LC visit:  Spoke via phone to Jo Palau NP from [place] [facility] office In regards to Hoy Register and gave NP report on the [facility] findings, see above notes.

## 2015-06-11 ENCOUNTER — Telehealth (HOSPITAL_COMMUNITY): Payer: Self-pay

## 2015-06-11 NOTE — Telephone Encounter (Signed)
Open encounter in error..

## 2015-06-11 NOTE — Telephone Encounter (Signed)
Looking for a medicaid provider to revise tongue tie.  Told her to contact Dr. [facility] and inquire.

## 2015-06-11 NOTE — Telephone Encounter (Signed)
Request for Dr. Kerry KassHansen's phone number. Gave patient requested information.

## 2015-06-12 ENCOUNTER — Telehealth (HOSPITAL_COMMUNITY): Payer: Self-pay | Admitting: Lactation Services

## 2015-06-12 NOTE — Telephone Encounter (Signed)
Call received back in regards to Jo  [name]'s  slow weight gain since birth and frenulum issue  LC reported to NP - another LC had received 2 different phone calls in regards to phone numbers and  Names of MD's that will tx frenulum issues. NP aware and reported Dr. Steele BergHinson and Dr. Christell ConstantMoore don't take Medicaid.  So [facility].  Per Jo [name] and mom were seen today in the office for weight check and Jo Alycia RossettiRyan only gained 3 1/2 oz in a week.  Which is acceptable for the lower end of gaining. NP and LC would like to see more.  F/U appt. Per NP is this coming Friday and LC recommended mom call LC office back for feeding assessment after Jo is assessed And tx by ENT. Jo Palaueresa Anderson, NP plans to  Discuss this recommendation with mom on Friday.

## 2015-06-12 NOTE — Telephone Encounter (Signed)
Left message for [name] in regards to [employer] Ryan [name]. LC left a message to call her back

## 2015-10-22 ENCOUNTER — Encounter (HOSPITAL_COMMUNITY): Payer: Self-pay | Admitting: Emergency Medicine

## 2015-11-06 ENCOUNTER — Encounter (HOSPITAL_COMMUNITY): Payer: Self-pay

## 2015-11-06 ENCOUNTER — Emergency Department (HOSPITAL_COMMUNITY): Payer: Medicaid Other

## 2015-11-06 ENCOUNTER — Emergency Department (HOSPITAL_COMMUNITY)
Admission: EM | Admit: 2015-11-06 | Discharge: 2015-11-06 | Disposition: A | Discharge status: Home / Self Care | Payer: Medicaid Other | Attending: Emergency Medicine | Admitting: Emergency Medicine

## 2015-11-06 DIAGNOSIS — Y929 Unspecified place or not applicable: Secondary | ICD-10-CM | POA: Insufficient documentation

## 2015-11-06 DIAGNOSIS — Y9389 Activity, other specified: Secondary | ICD-10-CM | POA: Diagnosis not present

## 2015-11-06 DIAGNOSIS — X501XXA Overexertion from prolonged static or awkward postures, initial encounter: Secondary | ICD-10-CM | POA: Insufficient documentation

## 2015-11-06 DIAGNOSIS — S99911A Unspecified injury of right ankle, initial encounter: Secondary | ICD-10-CM | POA: Diagnosis present

## 2015-11-06 DIAGNOSIS — S93401A Sprain of unspecified ligament of right ankle, initial encounter: Secondary | ICD-10-CM | POA: Diagnosis not present

## 2015-11-06 DIAGNOSIS — Z79899 Other long term (current) drug therapy: Secondary | ICD-10-CM | POA: Diagnosis not present

## 2015-11-06 DIAGNOSIS — Y999 Unspecified external cause status: Secondary | ICD-10-CM | POA: Diagnosis not present

## 2015-11-06 MED ORDER — HYDROCODONE-ACETAMINOPHEN 5-325 MG PO TABS
1.0000 | ORAL_TABLET | Freq: Once | ORAL | Status: AC
  Administered 2015-11-06: 1 via ORAL
  Filled 2015-11-06: qty 1

## 2015-11-06 MED ORDER — DICLOFENAC SODIUM 50 MG PO TBEC: 50.0000 mg | DELAYED_RELEASE_TABLET | Freq: Two times a day (BID) | ORAL | 0 refills | Status: DC

## 2015-11-06 NOTE — Discharge Instructions (Signed)
Take the medication as directed, elevate the area, apply ice and follow up with your doctor.

## 2015-11-06 NOTE — ED Provider Notes (Signed)
[facility]-EMERG[facility]Y DEPT Provider Note   CSN: [id number] Arrival date & time: 11/06/15  1547   By signing my name below, I, Jo [name], attest that this documentation has been prepared under the direction and in the presence of Jo Buffalo, NP Electronically Signed: Christel [name], Scribe. 11/06/2015. 5:21 PM.    History   Chief Complaint Chief Complaint  Patient presents with  . Ankle Injury    The history is provided by the patient. No language interpreter was used.  Ankle Injury  This is a new problem. The current episode started 6 to 12 hours ago. The problem occurs constantly. The problem has not changed since onset.Pertinent negatives include no chest pain, no abdominal pain, no headaches and no shortness of breath. The symptoms are aggravated by walking and standing. Nothing relieves the symptoms. She has tried nothing for the symptoms.   HPI Comments:  Jo [name] is a 25 y.o. female who presents to the Emergency Department complaining of sudden onset, constant ankle pain sustained after an injury earlier today. Pt reports that she was kicking a mushroom off of a tree and then slid into a hole. Pt describes the pain as 7/10 and that it is worsened when applying pressure. Pt notes no alleviating factors.  Pt denies previous ankle injuries.   Past Medical History:  Diagnosis Date  . Headache   . Heart murmur    bicuspid aorta valve    Patient Active Problem List   Diagnosis Date Noted  . Pregnancy 05/22/2015  . Post term pregnancy over 40 weeks 05/21/2015  . 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 AORTIC VALVE SURGERY  1994   Stated in Prenatal Record that she had surgery as baby to repair valve, but told our MD  the valve corrected itself  . MOUTH SURGERY      OB History    Gravida Para Term Preterm AB Living   2 2 2     2    SAB TAB Ectopic Multiple Live Births         0 2       Home Medications     Prior to Admission medications   Medication Sig Start Date End Date Taking? Authorizing Provider  diclofenac (VOLTAREN) 50 MG EC tablet Take 1 tablet (50 mg total) by mouth 2 (two) times daily. 11/06/15   Hope Orlene Och, NP  ibuprofen (ADVIL,MOTRIN) 800 MG tablet Take 1 tablet (800 mg total) by mouth every 6 (six) hours as needed for moderate pain or cramping. 05/23/15   Marlow Baars, MD  Prenatal Vit-Fe Fumarate-FA (PRENATAL MULTIVITAMIN) TABS tablet Take 1 tablet by mouth daily at 12 noon.    Historical Provider, MD    Family History Family History  Problem Relation Age of Onset  . Cancer Mother     cervical  . Miscarriages / Stillbirths Mother     3 miscarriages  . Arthritis Mother   . Depression Mother   . Learning disabilities Brother     ADHD  . Asthma Brother   . Birth defects Brother   . Cancer Maternal Aunt     cervical  . Mental illness Maternal Aunt   . Drug abuse Maternal Aunt   . Diabetes Paternal Aunt   . Drug abuse Paternal Aunt   . Diabetes Maternal Grandmother   . Heart disease Maternal Grandmother   . Hypertension Maternal Grandmother   . Heart disease Maternal Grandfather   .  Alcohol abuse Maternal Grandfather   . Alcohol abuse Father   . Drug abuse Father   . Mental illness Father   . Heart disease Paternal Grandmother   . Anesthesia problems Neg Hx   . Hypotension Neg Hx   . Malignant hyperthermia Neg Hx   . Pseudochol deficiency Neg Hx     Social History Social History  Substance Use Topics  . Smoking status: Never Smoker  . Smokeless tobacco: Never Used  . Alcohol use No     Allergies   Lemon flavor; Orange flavor; and Aspirin   Review of Systems Review of Systems  Constitutional: Negative for fever.  Respiratory: Negative for shortness of breath.   Cardiovascular: Negative for chest pain.  Gastrointestinal: Negative for abdominal pain.  Musculoskeletal: Positive for arthralgias and joint swelling.       Right ankle pain   Neurological: Negative for headaches.  All other systems reviewed and are negative.    Physical Exam Updated Vital Signs BP 114/79   Pulse 79   Temp 98.2 F (36.8 C) (Oral)   Resp 18   SpO2 99%   Physical Exam  Constitutional: She is oriented to person, place, and time. She appears well-developed and well-nourished. No distress.  HENT:  Head: Normocephalic and atraumatic.  Eyes: Conjunctivae are normal.  Neck: Neck supple.  Cardiovascular: Normal rate.   Pulmonary/Chest: Effort normal and breath sounds normal. No respiratory distress.  Musculoskeletal: She exhibits tenderness.       Right ankle: She exhibits swelling. She exhibits no deformity, no laceration and normal pulse. Decreased range of motion: due to pain. Tenderness. Lateral malleolus tenderness found. Achilles tendon normal.       Feet:  Plantar and dorsiflexion without difficulty, good strength. No achilles defect or tenderness noted. Significant swelling noted to lateral malleolus. Tenderness with eversion and inversion of the ankle  Neurological: She is alert and oriented to person, place, and time.  Skin: Skin is warm and dry.  Psychiatric: She has a normal mood and affect. Her behavior is normal.  Nursing note and vitals reviewed.    ED Treatments / Results  Labs (all labs ordered are listed, but only abnormal results are displayed) Labs Reviewed - No data to display  Radiology Dg Ankle Complete Right  Result Date: 11/06/2015 CLINICAL DATA:  25 year old female with right lateral ankle pain and swelling after twisting injury EXAM: RIGHT ANKLE - COMPLETE 3+ VIEW COMPARISON:  None. FINDINGS: Soft tissue swelling about the lateral malleolus. No evidence of fracture or malalignment. The ankle mortise is congruent in the tailor dome intact. Accessory ossicle adjacent to the inferior aspect of the medial malleolus. The visualized bones and joints of the foot are unremarkable. IMPRESSION: Soft tissue swelling  without evidence of fracture or malalignment. Electronically Signed   By: Malachy Moan M.D.   On: 11/06/2015 16:37    Procedures Procedures (including critical care time)  Medications Ordered in ED Medications  HYDROcodone-acetaminophen (NORCO/VICODIN) 5-325 MG per tablet 1 tablet (1 tablet Oral Given 11/06/15 1723)     Initial Impression / Assessment and Plan / ED Course   I have reviewed the triage vital signs and the nursing notes.  Pertinent labs & imaging results that were available during my care of the patient were reviewed by me and considered in my medical decision making (see chart for details).  Clinical Course  splint, ice, elevation, crutches and pain management.   Final Clinical Impressions(s) / ED Diagnoses  25 y.o. female with  right ankle pain and swelling s/p injury stable for d/c without focal neuro deficits. No concern at this time for compartment syndrome. Instruction to patient regarding signs and symptoms that would indicate need for return. She will f/u with her PCP.   Final diagnoses:  Ankle sprain, right, initial encounter    New Prescriptions New Prescriptions   DICLOFENAC (VOLTAREN) 50 MG EC TABLET    Take 1 tablet (50 mg total) by mouth 2 (two) times daily.     [address]Hope BixbyM Neese, [place]NP 11/06/15 1732    Raeford RazorStephen Kohut, MD 11/15/15 1600

## 2015-11-06 NOTE — ED Notes (Signed)
Pt verbalized understanding of d/c instructions and has no further questions. Pt stable and NAD. Pt d/c home with family driving. Pt given instructions for proper crutch and aircast use.

## 2015-11-06 NOTE — ED Notes (Signed)
Ice pack applied to right ankle.

## 2016-06-17 ENCOUNTER — Emergency Department (HOSPITAL_COMMUNITY)
Admission: EM | Admit: 2016-06-17 | Discharge: 2016-06-17 | Disposition: A | Discharge status: Home / Self Care | Payer: Medicaid Other | Attending: Emergency Medicine | Admitting: Emergency Medicine

## 2016-06-17 ENCOUNTER — Encounter (HOSPITAL_COMMUNITY): Payer: Self-pay

## 2016-06-17 DIAGNOSIS — J039 Acute tonsillitis, unspecified: Secondary | ICD-10-CM | POA: Insufficient documentation

## 2016-06-17 DIAGNOSIS — J029 Acute pharyngitis, unspecified: Secondary | ICD-10-CM | POA: Diagnosis present

## 2016-06-17 DIAGNOSIS — Z79899 Other long term (current) drug therapy: Secondary | ICD-10-CM | POA: Insufficient documentation

## 2016-06-17 LAB — RAPID STREP SCREEN (MED CTR MEBANE ONLY): Streptococcus, Group A Screen (Direct): NEGATIVE

## 2016-06-17 MED ORDER — AMOXICILLIN-POT CLAVULANATE 875-125 MG PO TABS: 1.0000 | ORAL_TABLET | Freq: Two times a day (BID) | ORAL | 0 refills | Status: DC

## 2016-06-17 MED ORDER — DEXAMETHASONE SODIUM PHOSPHATE 10 MG/ML IJ SOLN
10.0000 mg | Freq: Once | INTRAMUSCULAR | Status: AC
  Administered 2016-06-17: 10 mg via INTRAMUSCULAR
  Filled 2016-06-17: qty 1

## 2016-06-17 NOTE — ED Provider Notes (Signed)
[facility] Provider Note   CSN: [id number] Arrival date & time: 06/17/16  7353   By signing my name below, I, Freida Busman, attest that this documentation has been prepared under the direction and in the presence of Teressa Lower, NP. Electronically Signed: Freida Busman, Scribe. 06/17/2016. 11:10 AM.  History   Chief Complaint Chief Complaint  Patient presents with  . Sore Throat    The history is provided by the patient. No language interpreter was used.    HPI Comments:  Jo [name] is a 26 y.o. female who presents to the Emergency Department complaining of fullness to the right face and throat since last night. Pt reports associated difficulty swallowing; states she is able to tolerate fluids but not solid foods. No alleviating factors noted. She has a h/o recurrent tonsilitis and is scheduled for a tonsillectomy on 07/03/16. Pt is currently breastfeeding.   Past Medical History:  Diagnosis Date  . Headache   . Heart murmur    bicuspid aorta valve    Patient Active Problem List   Diagnosis Date Noted  . Pregnancy 05/22/2015  . Post term pregnancy over 40 weeks 05/21/2015  . 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 AORTIC VALVE SURGERY  1994   Stated in Prenatal Record that she had surgery as baby to repair valve, but told our MD  the valve corrected itself  . MOUTH SURGERY      OB History    Gravida Para Term Preterm AB Living   SAB TAB Ectopic Multiple Live Births         0 2       Home Medications    Prior to Admission medications   Medication Sig Start Date End Date Taking? Authorizing Provider  diclofenac (VOLTAREN) 50 MG EC tablet Take 1 tablet (50 mg total) by mouth 2 (two) times daily. 11/06/15   Hope Orlene Och, NP  ibuprofen (ADVIL,MOTRIN) 800 MG tablet Take 1 tablet (800 mg total) by mouth every 6 (six) hours as needed for moderate pain or cramping. 05/23/15   Marlow Baars, MD    Prenatal Vit-Fe Fumarate-FA (PRENATAL MULTIVITAMIN) TABS tablet Take 1 tablet by mouth daily at 12 noon.    Historical Provider, MD    Family History Family History  Problem Relation Age of Onset  . Cancer Mother     cervical  . Miscarriages / Stillbirths Mother     3 miscarriages  . Arthritis Mother   . Depression Mother   . Learning disabilities Brother     ADHD  . Asthma Brother   . Birth defects Brother   . Cancer Maternal Aunt     cervical  . Mental illness Maternal Aunt   . Drug abuse Maternal Aunt   . Diabetes Paternal Aunt   . Drug abuse Paternal Aunt   . Diabetes Maternal Grandmother   . Heart disease Maternal Grandmother   . Hypertension Maternal Grandmother   . Heart disease Maternal Grandfather   . Alcohol abuse Maternal Grandfather   . Alcohol abuse Father   . Drug abuse Father   . Mental illness Father   . Heart disease Paternal Grandmother   . Anesthesia problems Neg Hx   . Hypotension Neg Hx   . Malignant hyperthermia Neg Hx   . Pseudochol deficiency Neg Hx     Social History Social History  Substance Use Topics  .  Smoking status: Never Smoker  . Smokeless tobacco: Never Used  . Alcohol use No     Allergies   Lemon flavor; Orange flavor; and Aspirin   Review of Systems Review of Systems  Constitutional: Negative for chills and fever.  HENT: Positive for facial swelling, sore throat and trouble swallowing.   Cardiovascular: Negative for chest pain.     Physical Exam Updated Vital Signs BP 134/86 (BP Location: Left Arm)   Pulse 68   Temp 98.2 F (36.8 C) (Oral)   Resp 18   Ht  (1.626 m)   Wt 210 lb (95.3 kg)   SpO2 100%   BMI 36.05 kg/m   Physical Exam  Constitutional: She is oriented to person, place, and time. She appears well-developed and well-nourished. No distress.  HENT:  Head: Normocephalic and atraumatic.  Bilateral swollen and exudative tonsil. No sign of pta. Tolerating secretions without any problem  Eyes:  Conjunctivae are normal.  Cardiovascular: Normal rate.   Pulmonary/Chest: Effort normal.  Abdominal: She exhibits no distension.  Neurological: She is alert and oriented to person, place, and time.  Skin: Skin is warm and dry.  Psychiatric: She has a normal mood and affect.  Nursing note and vitals reviewed.    ED Treatments / Results  DIAGNOSTIC STUDIES:  Oxygen Saturation is 100% on RA, normal by my interpretation.    COORDINATION OF CARE:  11:10 AM Discussed treatment plan with pt at bedside and pt agreed to plan.  Labs (all labs ordered are listed, but only abnormal results are displayed) Labs Reviewed  RAPID STREP SCREEN (NOT AT [facility])    EKG  EKG Interpretation None       Radiology No results found.  Procedures Procedures (including critical care time)  Medications Ordered in ED Medications - No data to display   Initial Impression / Assessment and Plan / ED Course  I have reviewed the triage vital signs and the nursing notes.  Pertinent labs & imaging results that were available during my care of the patient were reviewed by me and considered in my medical decision making (see chart for details).     No sign of pta. Will treat with steroids and antibiotics. Discussed with pt that if symptoms worse she should follow up with ent  Final Clinical Impressions(s) / ED Diagnoses   Final diagnoses:  None    New Prescriptions New Prescriptions   No medications on file   I personally performed the services described in this documentation, which was scribed in my presence. The recorded information has been reviewed and is accurate.     Teressa Lower, NP 06/17/16 1123    Raeford Razor, MD 06/25/16 [phone]

## 2016-06-17 NOTE — ED Triage Notes (Signed)
Per Pt, Pt is coming from home with a 8/10 sore throat that started yesterday. Reports congestion, but denies cough or fever

## 2016-06-19 LAB — CULTURE, GROUP A STREP (THRC)

## 2016-07-03 ENCOUNTER — Other Ambulatory Visit: Payer: Self-pay | Admitting: Otolaryngology

## 2017-03-28 ENCOUNTER — Encounter (HOSPITAL_COMMUNITY): Payer: Self-pay | Admitting: Emergency Medicine

## 2017-03-28 ENCOUNTER — Emergency Department (HOSPITAL_COMMUNITY): Payer: Medicaid Other

## 2017-03-28 ENCOUNTER — Other Ambulatory Visit: Payer: Self-pay

## 2017-03-28 ENCOUNTER — Emergency Department (HOSPITAL_COMMUNITY)
Admission: EM | Admit: 2017-03-28 | Discharge: 2017-03-28 | Disposition: A | Discharge status: Home / Self Care | Payer: Medicaid Other | Attending: Emergency Medicine | Admitting: Emergency Medicine

## 2017-03-28 DIAGNOSIS — T148XXA Other injury of unspecified body region, initial encounter: Secondary | ICD-10-CM | POA: Insufficient documentation

## 2017-03-28 DIAGNOSIS — Y939 Activity, unspecified: Secondary | ICD-10-CM | POA: Insufficient documentation

## 2017-03-28 DIAGNOSIS — M545 Low back pain: Secondary | ICD-10-CM | POA: Diagnosis not present

## 2017-03-28 DIAGNOSIS — Z79899 Other long term (current) drug therapy: Secondary | ICD-10-CM | POA: Insufficient documentation

## 2017-03-28 DIAGNOSIS — S20219A Contusion of unspecified front wall of thorax, initial encounter: Secondary | ICD-10-CM | POA: Diagnosis not present

## 2017-03-28 DIAGNOSIS — S2002XA Contusion of left breast, initial encounter: Secondary | ICD-10-CM | POA: Diagnosis not present

## 2017-03-28 DIAGNOSIS — S301XXA Contusion of abdominal wall, initial encounter: Secondary | ICD-10-CM | POA: Insufficient documentation

## 2017-03-28 DIAGNOSIS — Y998 Other external cause status: Secondary | ICD-10-CM | POA: Insufficient documentation

## 2017-03-28 DIAGNOSIS — Y92410 Unspecified street and highway as the place of occurrence of the external cause: Secondary | ICD-10-CM | POA: Diagnosis not present

## 2017-03-28 DIAGNOSIS — S299XXA Unspecified injury of thorax, initial encounter: Secondary | ICD-10-CM | POA: Diagnosis present

## 2017-03-28 LAB — I-STAT BETA HCG BLOOD, ED (MC, WL, AP ONLY)

## 2017-03-28 MED ORDER — ACETAMINOPHEN 500 MG PO TABS
1000.0000 mg | ORAL_TABLET | Freq: Once | ORAL | Status: AC
  Administered 2017-03-28: 1000 mg via ORAL
  Filled 2017-03-28: qty 2

## 2017-03-28 MED ORDER — METHOCARBAMOL 500 MG PO TABS: 500.0000 mg | ORAL_TABLET | Freq: Two times a day (BID) | ORAL | 0 refills | Status: DC
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
## 2017-04-01 ENCOUNTER — Other Ambulatory Visit: Payer: Self-pay | Admitting: Physician Assistant

## 2017-04-01 DIAGNOSIS — S2002XA Contusion of left breast, initial encounter: Secondary | ICD-10-CM

## 2017-04-08 ENCOUNTER — Other Ambulatory Visit: Payer: Self-pay | Admitting: Physician Assistant

## 2017-04-08 ENCOUNTER — Ambulatory Visit
Admission: RE | Admit: 2017-04-08 | Discharge: 2017-04-08 | Disposition: A | Discharge status: Home / Self Care | Payer: Medicaid Other | Source: Ambulatory Visit | Attending: Physician Assistant | Admitting: Physician Assistant

## 2017-04-08 DIAGNOSIS — S2002XA Contusion of left breast, initial encounter: Secondary | ICD-10-CM

## 2017-07-06 ENCOUNTER — Ambulatory Visit
Admission: RE | Admit: 2017-07-06 | Discharge: 2017-07-06 | Disposition: A | Discharge status: Home / Self Care | Payer: Medicaid Other | Source: Ambulatory Visit | Attending: Physician Assistant | Admitting: Physician Assistant

## 2017-07-06 DIAGNOSIS — S2002XA Contusion of left breast, initial encounter: Secondary | ICD-10-CM

## 2021-04-06 ENCOUNTER — Other Ambulatory Visit: Payer: Self-pay

## 2021-04-06 ENCOUNTER — Emergency Department (HOSPITAL_COMMUNITY)
Admission: EM | Admit: 2021-04-06 | Discharge: 2021-04-06 | Disposition: A | Discharge status: Home / Self Care | Payer: Medicaid Other | Attending: Emergency Medicine | Admitting: Emergency Medicine

## 2021-04-06 ENCOUNTER — Emergency Department (HOSPITAL_COMMUNITY): Payer: Medicaid Other

## 2021-04-06 DIAGNOSIS — R112 Nausea with vomiting, unspecified: Secondary | ICD-10-CM | POA: Diagnosis not present

## 2021-04-06 DIAGNOSIS — N9489 Other specified conditions associated with female genital organs and menstrual cycle: Secondary | ICD-10-CM | POA: Insufficient documentation

## 2021-04-06 DIAGNOSIS — R109 Unspecified abdominal pain: Secondary | ICD-10-CM | POA: Diagnosis not present

## 2021-04-06 DIAGNOSIS — R110 Nausea: Secondary | ICD-10-CM

## 2021-04-06 LAB — COMPREHENSIVE METABOLIC PANEL
ALT: 36 U/L (ref 0–44)
AST: 56 U/L — ABNORMAL HIGH (ref 15–41)
Albumin: 4.3 g/dL (ref 3.5–5.0)
Alkaline Phosphatase: 43 U/L (ref 38–126)
Anion gap: 12 (ref 5–15)
BUN: 7 mg/dL (ref 6–20)
CO2: 26 mmol/L (ref 22–32)
Calcium: 9.7 mg/dL (ref 8.9–10.3)
Chloride: 103 mmol/L (ref 98–111)
Creatinine, Ser: 0.67 mg/dL (ref 0.44–1.00)
GFR, Estimated: >60 mL/min (ref >60)
Glucose, Bld: 164 mg/dL — ABNORMAL HIGH (ref 70–99)
Potassium: 3.7 mmol/L (ref 3.5–5.1)
Sodium: 141 mmol/L (ref 135–145)
Total Bilirubin: 1.5 mg/dL — ABNORMAL HIGH (ref 0.3–1.2)
Total Protein: 6.9 g/dL (ref 6.5–8.1)

## 2021-04-06 LAB — CBC
HCT: 41.5 % (ref 36.0–46.0)
Hemoglobin: 14.1 g/dL (ref 12.0–15.0)
MCH: 28.9 pg (ref 26.0–34.0)
MCHC: 34 g/dL (ref 30.0–36.0)
MCV: 85 fL (ref 80.0–100.0)
Platelets: 206 10*3/uL (ref 150–400)
RBC: 4.88 MIL/uL (ref 3.87–5.11)
RDW: 11.6 % (ref 11.5–15.5)
WBC: 11.3 10*3/uL — ABNORMAL HIGH (ref 4.0–10.5)
nRBC: 0 % (ref 0.0–0.2)

## 2021-04-06 LAB — I-STAT BETA HCG BLOOD, ED (MC, WL, AP ONLY): I-stat hCG, quantitative: <5 m[IU]/mL (ref <5)

## 2021-04-06 LAB — LIPASE, BLOOD: Lipase: 35 U/L (ref 11–51)

## 2021-04-06 MED ORDER — SODIUM CHLORIDE 0.9 % IV BOLUS
1000.0000 mL | Freq: Once | INTRAVENOUS | Status: AC
  Administered 2021-04-06: 1000 mL via INTRAVENOUS

## 2021-04-06 MED ORDER — ONDANSETRON HCL 4 MG/2ML IJ SOLN
4.0000 mg | Freq: Once | INTRAMUSCULAR | Status: AC
Start: 2021-04-06 — End: 2021-04-06
  Administered 2021-04-06: 4 mg via INTRAVENOUS
  Filled 2021-04-06: qty 2

## 2021-04-06 MED ORDER — ONDANSETRON HCL 4 MG PO TABS: 4.0000 mg | ORAL_TABLET | Freq: Three times a day (TID) | ORAL | 0 refills | Status: AC | PRN

## 2021-04-06 NOTE — ED Notes (Signed)
Pt NAD, a/ox4. Pt verbalizes understanding of all DC and f/u instructions. All questions answered. Pt walks with steady gait to lobby at DC.  ? ?

## 2021-04-06 NOTE — ED Provider Notes (Signed)
[facility]   CSN: [id number] Arrival date & time: 04/06/21  1316     History  Chief Complaint  Patient presents with   Abdominal Pain   Emesis    Jo [name] is a 31 y.o. female.  The history is provided by the patient.  Abdominal Pain Pain location:  R flank Pain quality: sharp   Pain radiates to:  Does not radiate Pain severity:  Severe Onset quality:  Sudden Duration:  1 hour Timing:  Constant Progression:  Improving Chronicity:  New Context: not eating and not sick contacts   Relieved by:  Nothing Worsened by:  Nothing Associated symptoms: vomiting   Associated symptoms: no anorexia, no belching, no chest pain, no chills, no constipation, no cough, no diarrhea, no dysuria, no fatigue, no fever, no flatus, no melena, no nausea, no shortness of breath and no sore throat   Risk factors: has not had multiple surgeries and not pregnant   Emesis Associated symptoms: abdominal pain   Associated symptoms: no chills, no cough, no diarrhea, no fever and no sore throat       Home Medications Prior to Admission medications   Medication Sig Start Date End Date Taking? Authorizing Provider  ondansetron (ZOFRAN) 4 MG tablet Take 1 tablet (4 mg total) by mouth every 8 (eight) hours as needed for up to 12 doses for nausea or vomiting. 04/06/21  Yes Field Staniszewski, DO  amoxicillin-clavulanate (AUGMENTIN) 875-125 MG tablet Take 1 tablet by mouth every 12 (twelve) hours. 06/17/16   Teressa LowerPickering, Vrinda, NP  diclofenac (VOLTAREN) 50 MG EC tablet Take 1 tablet (50 mg total) by mouth 2 (two) times daily. 11/06/15   Janne NapoleonNeese, Hope M, NP  ibuprofen (ADVIL,MOTRIN) 800 MG tablet Take 1 tablet (800 mg total) by mouth every 6 (six) hours as needed for moderate pain or cramping. 05/23/15   Marlow Baarslark, Dyanna, MD  methocarbamol (ROBAXIN) 500 MG tablet Take 1 tablet (500 mg total) by mouth 2 (two) times daily. 03/28/17   Maxwell CaulLayden, Lindsey A, PA-C  Prenatal  Vit-Fe Fumarate-FA (PRENATAL MULTIVITAMIN) TABS tablet Take 1 tablet by mouth daily at 12 noon.    [provider]      Allergies    Lemon flavor, Orange flavor, Amoxicillin, and Aspirin    Review of Systems   Review of Systems  Constitutional:  Negative for chills, fatigue and fever.  HENT:  Negative for sore throat.   Respiratory:  Negative for cough and shortness of breath.   Cardiovascular:  Negative for chest pain.  Gastrointestinal:  Positive for abdominal pain and vomiting. Negative for anorexia, constipation, diarrhea, flatus, melena and nausea.  Genitourinary:  Negative for dysuria.   Physical Exam Updated Vital Signs  ED Triage Vitals  Enc Vitals Group     BP 04/06/21 1326 99/65     Pulse Rate 04/06/21 1326 (!) 51     Resp 04/06/21 1326 20     Temp 04/06/21 1326 98.4 F (36.9 C)     Temp Source 04/06/21 1326 Oral     SpO2 04/06/21 1326 99 %     Weight 04/06/21 1327 115 lb (52.2 kg)     Height 04/06/21 1327 4\' 11"  (1.499 m)     Head Circumference --      Peak Flow --      Pain Score 04/06/21 1326 10     Pain Loc --      Pain Edu? --      Excl.  in GC? --     Physical Exam Vitals and nursing note reviewed.  Constitutional:      General: She is not in acute distress.    Appearance: She is well-developed. She is not ill-appearing.  HENT:     Head: Normocephalic and atraumatic.     Mouth/Throat:     Mouth: Mucous membranes are moist.  Eyes:     Extraocular Movements: Extraocular movements intact.     Conjunctiva/sclera: Conjunctivae normal.  Cardiovascular:     Rate and Rhythm: Normal rate and regular rhythm.     Heart sounds: Normal heart sounds. No murmur heard. Pulmonary:     Effort: Pulmonary effort is normal. No respiratory distress.     Breath sounds: Normal breath sounds.  Abdominal:     General: There is no distension.     Palpations: Abdomen is soft.     Tenderness: There is no abdominal tenderness. There is right CVA tenderness.   Musculoskeletal:        General: No swelling.     Cervical back: Neck supple.  Skin:    General: Skin is warm and dry.     Capillary Refill: Capillary refill takes less than 2 seconds.  Neurological:     General: No focal deficit present.     Mental Status: She is alert.  Psychiatric:        Mood and Affect: Mood normal.    ED Results / Procedures / Treatments   Labs (all labs ordered are listed, but only abnormal results are displayed) Labs Reviewed  COMPREHENSIVE METABOLIC PANEL - Abnormal; Notable for the following components:      Result Value   Glucose, Bld 164 (*)    AST 56 (*)    Total Bilirubin 1.5 (*)    All other components within normal limits  CBC - Abnormal; Notable for the following components:   WBC 11.3 (*)    All other components within normal limits  LIPASE, BLOOD  I-STAT BETA HCG BLOOD, ED ([facility], [facility], [facility] ONLY)    EKG EKG Interpretation  Date/Time:  Saturday April 06 2021 13:58:45 EST Ventricular Rate:  55 PR Interval:  138 QRS Duration: 108 QT Interval:  432 QTC Calculation: 414 R Axis:   95 Text Interpretation: Sinus rhythm Borderline right axis deviation Confirmed by Virgina Norfolk ([id number]) on 04/06/2021 2:05:25 PM  Radiology CT Renal Stone Study  Result Date: 04/06/2021 CLINICAL DATA:  Sudden onset right upper quadrant abdominal pain radiating to the mid abdomen EXAM: CT ABDOMEN AND PELVIS WITHOUT CONTRAST TECHNIQUE: Multidetector CT imaging of the abdomen and pelvis was performed following the standard protocol without IV contrast. RADIATION DOSE REDUCTION: This exam was performed according to the departmental dose-optimization program which includes automated exposure control, adjustment of the mA and/or kV according to patient size and/or use of iterative reconstruction technique. COMPARISON:  None. FINDINGS: Lower chest: No acute abnormality. Hepatobiliary: Unremarkable noncontrast appearance of the hepatic parenchyma. Gallbladder is distended  without pericholecystic fluid or radiopaque cholelithiasis. No biliary ductal dilation. Pancreas: No pancreatic ductal dilation or evidence of acute inflammation. Spleen: Within normal limits. Adrenals/Urinary Tract: Bilateral adrenal glands appear normal. No hydronephrosis. Punctate nonobstructive right upper pole renal stone. No obstructive ureteral or bladder calculi identified. Urinary bladder is unremarkable for degree of distension. Stomach/Bowel: No enteric contrast was administered. Stomach is moderately distended with ingested material without wall thickening. No pathologic dilation of small or large bowel. Terminal ileum appears normal. The appendix is not confidently identified however there  is no pericecal inflammation. Moderate volume of formed stool throughout the colon suggestive of constipation. Vascular/Lymphatic: No significant vascular findings are present. No enlarged abdominal or pelvic lymph nodes. Reproductive: Unremarkable non contrasted premenopausal CT appearance of the uterus and bilateral adnexa. Other: Trace pelvic free fluid is within normal limits for a reproductive age female. Musculoskeletal: Mild L5-S1 discogenic disease. No acute osseous abnormality. IMPRESSION: 1. Punctate nonobstructive right upper pole renal stone. No obstructive ureteral or bladder calculi identified. 2. Dilated gallbladder without pericholecystic fluid or radiopaque cholelithiasis. Recommend correlation with right upper quadrant tenderness to palpation. 3. Moderate volume of formed stool throughout the colon suggestive of constipation. Electronically Signed   By: Maudry Mayhew M.D.   On: 04/06/2021 14:31    Procedures Procedures    Medications Ordered in ED Medications  ondansetron (ZOFRAN) injection 4 mg (4 mg Intravenous Given 04/06/21 1359)  sodium chloride 0.9 % bolus 1,000 mL (1,000 mLs Intravenous New Bag/Given 04/06/21 1359)    ED Course/ Medical Decision Making/ A&P                            Medical Decision Making Amount and/or Complexity of Data Reviewed Labs: ordered. Radiology: ordered.  Risk Prescription drug management.   Jo [name] is here with right flank pain.  Normal vitals.  No fever.  Sudden flank pain that started just prior to arrival.  No history of kidney stones or other significant medical history.  Has not had abdominal surgery in the past.  Pain is improving.  She is tender mostly in the right flank.  Has not noticed any blood in her urine.  No vaginal discharge or bleeding.  Differential diagnosis includes kidney stone versus less likely cholecystitis, pancreatitis.  Possible that this is musculoskeletal.  Overall we will get a CT scan of abdomen and pelvis, CBC, CMP, lipase, urinalysis, pregnancy test.  Will give IV fluids and IV Zofran.  Per my review and interpretation of labs, patient with no significant anemia or electrolyte abnormality.  Pregnancy test is negative.  Lipase is normal.  No major leukocytosis.  Liver enzymes unremarkable.  Bilirubin is 1.5.  Per radiology read a CT scan per my review shows nonobstructive right upper pole renal stone.  Mildly dilated gallbladder but no evidence of pericholecystic fluid or gallstones.  There is also moderate volume of stool as well.  Overall on reexamination she is not having any pain.  She is not having any tenderness in the right upper quadrant.  Pain was mostly in the right flank.  She denies any pain with urination.  Overall suspect that this could be constipation or musculoskeletal pain.  I did have discussion with the patient about her gallbladder findings.  Overall do not think that she has acute cholecystitis.  However did recommend close follow-up with her primary care doctor for an outpatient ultrasound and repeat lab work if she still having some issues.  She understands to return if she develops any nausea, vomiting, fever, persistent pain.  Will prescribe Zofran and discharged in ED in good condition.   Recommend bowel regimen with MiraLAX.  Patient was agreeable to this plan and was discharged.  This chart was dictated using voice recognition software.  Despite best efforts to proofread,  errors can occur which can change the documentation meaning.         Final Clinical Impression(s) / ED Diagnoses Final diagnoses:  Flank pain  Nausea    Rx /  DC Orders ED Discharge Orders          Ordered    ondansetron (ZOFRAN) 4 MG tablet  Every 8 hours PRN        04/06/21 1456              Virgina NorfolkCuratolo, Florena Kozma, DO 04/06/21 1456

## 2021-04-06 NOTE — ED Triage Notes (Signed)
C/o of sudden onset of RUQ abdominal pain radiates to the middle with position change. Patient reported pain started 2 hours ago, and followed by vomiting. Denies prior medical or surgical hx.

## 2021-04-06 NOTE — Discharge Instructions (Signed)
Follow-up with your primary care doctor to discuss getting possible ultrasound of your gallbladder if you are still having ongoing issues.  Recommend they be rechecking your liver and gallbladder enzymes as well.  If you develop more persistent pain, nausea, vomiting, fever please return for evaluation as discussed.  Use Zofran as needed for nausea and vomiting.

## 2021-04-08 ENCOUNTER — Ambulatory Visit (HOSPITAL_COMMUNITY)
Admission: EM | Admit: 2021-04-08 | Discharge: 2021-04-08 | Disposition: A | Discharge status: Home / Self Care | Payer: Medicaid Other | Attending: Emergency Medicine | Admitting: Emergency Medicine

## 2021-04-08 ENCOUNTER — Encounter (HOSPITAL_COMMUNITY): Payer: Self-pay

## 2021-04-08 ENCOUNTER — Other Ambulatory Visit: Payer: Self-pay

## 2021-04-08 ENCOUNTER — Emergency Department (HOSPITAL_COMMUNITY): Payer: Medicaid Other | Admitting: Anesthesiology

## 2021-04-08 ENCOUNTER — Emergency Department (HOSPITAL_COMMUNITY): Payer: Medicaid Other

## 2021-04-08 ENCOUNTER — Encounter (HOSPITAL_COMMUNITY)
Admission: EM | Disposition: A | Discharge status: Home / Self Care | Payer: Self-pay | Source: Home / Self Care | Attending: Emergency Medicine

## 2021-04-08 DIAGNOSIS — R109 Unspecified abdominal pain: Secondary | ICD-10-CM

## 2021-04-08 DIAGNOSIS — Z20822 Contact with and (suspected) exposure to covid-19: Secondary | ICD-10-CM | POA: Insufficient documentation

## 2021-04-08 DIAGNOSIS — K8010 Calculus of gallbladder with chronic cholecystitis without obstruction: Secondary | ICD-10-CM | POA: Diagnosis not present

## 2021-04-08 DIAGNOSIS — K810 Acute cholecystitis: Secondary | ICD-10-CM

## 2021-04-08 HISTORY — DX: Nausea with vomiting, unspecified: R11.2

## 2021-04-08 HISTORY — PX: CHOLECYSTECTOMY: SHX55

## 2021-04-08 HISTORY — DX: Other specified postprocedural states: Z98.890

## 2021-04-08 LAB — CBC
HCT: 38.7 % (ref 36.0–46.0)
Hemoglobin: 12.8 g/dL (ref 12.0–15.0)
MCH: 28.8 pg (ref 26.0–34.0)
MCHC: 33.1 g/dL (ref 30.0–36.0)
MCV: 87 fL (ref 80.0–100.0)
Platelets: 180 10*3/uL (ref 150–400)
RBC: 4.45 MIL/uL (ref 3.87–5.11)
RDW: 11.7 % (ref 11.5–15.5)
WBC: 7 10*3/uL (ref 4.0–10.5)
nRBC: 0 % (ref 0.0–0.2)

## 2021-04-08 LAB — COMPREHENSIVE METABOLIC PANEL
ALT: 168 U/L — ABNORMAL HIGH (ref 0–44)
AST: 116 U/L — ABNORMAL HIGH (ref 15–41)
Albumin: 3.9 g/dL (ref 3.5–5.0)
Alkaline Phosphatase: 72 U/L (ref 38–126)
Anion gap: 7 (ref 5–15)
BUN: 9 mg/dL (ref 6–20)
CO2: 29 mmol/L (ref 22–32)
Calcium: 8.9 mg/dL (ref 8.9–10.3)
Chloride: 103 mmol/L (ref 98–111)
Creatinine, Ser: 0.62 mg/dL (ref 0.44–1.00)
GFR, Estimated: >60 mL/min (ref >60)
Glucose, Bld: 116 mg/dL — ABNORMAL HIGH (ref 70–99)
Potassium: 3.5 mmol/L (ref 3.5–5.1)
Sodium: 139 mmol/L (ref 135–145)
Total Bilirubin: 0.7 mg/dL (ref 0.3–1.2)
Total Protein: 6.5 g/dL (ref 6.5–8.1)

## 2021-04-08 LAB — RESP PANEL BY RT-PCR (FLU A&B, COVID) ARPGX2
Influenza A by PCR: NEGATIVE
Influenza B by PCR: NEGATIVE
SARS Coronavirus 2 by RT PCR: NEGATIVE

## 2021-04-08 LAB — URINALYSIS, ROUTINE W REFLEX MICROSCOPIC
Bilirubin Urine: NEGATIVE
Glucose, UA: NEGATIVE mg/dL
Hgb urine dipstick: NEGATIVE
Ketones, ur: NEGATIVE mg/dL
Leukocytes,Ua: NEGATIVE
Nitrite: NEGATIVE
Protein, ur: NEGATIVE mg/dL
Specific Gravity, Urine: 1.015 (ref 1.005–1.030)
pH: 8.5 — ABNORMAL HIGH (ref 5.0–8.0)

## 2021-04-08 LAB — LIPASE, BLOOD: Lipase: 38 U/L (ref 11–51)

## 2021-04-08 LAB — I-STAT BETA HCG BLOOD, ED (MC, WL, AP ONLY): I-stat hCG, quantitative: <5 m[IU]/mL (ref <5)

## 2021-04-08 SURGERY — LAPAROSCOPIC CHOLECYSTECTOMY WITH INTRAOPERATIVE CHOLANGIOGRAM
Anesthesia: General | Site: Abdomen

## 2021-04-08 MED ORDER — FENTANYL CITRATE PF 50 MCG/ML IJ SOSY
50.0000 ug | PREFILLED_SYRINGE | Freq: Once | INTRAMUSCULAR | Status: DC
  Filled 2021-04-08: qty 1

## 2021-04-08 MED ORDER — FENTANYL CITRATE (PF) 100 MCG/2ML IJ SOLN
25.0000 ug | INTRAMUSCULAR | Status: DC | PRN
  Administered 2021-04-08: 25 ug via INTRAVENOUS

## 2021-04-08 MED ORDER — BUPIVACAINE-EPINEPHRINE 0.25% -1:200000 IJ SOLN
INTRAMUSCULAR | Status: DC | PRN
  Administered 2021-04-08: 10 mL

## 2021-04-08 MED ORDER — LIDOCAINE 2% (20 MG/ML) 5 ML SYRINGE
INTRAMUSCULAR | Status: AC
  Filled 2021-04-08: qty 5

## 2021-04-08 MED ORDER — OXYCODONE HCL 5 MG PO TABS: 5.0000 mg | ORAL_TABLET | Freq: Once | ORAL | Status: DC | PRN

## 2021-04-08 MED ORDER — PHENYLEPHRINE HCL-NACL 20-0.9 MG/250ML-% IV SOLN
INTRAVENOUS | Status: DC | PRN
  Administered 2021-04-08: 10 ug/min via INTRAVENOUS

## 2021-04-08 MED ORDER — OXYCODONE-ACETAMINOPHEN 5-325 MG PO TABS
1.0000 | ORAL_TABLET | ORAL | 0 refills | Status: DC | PRN
Start: 2021-04-08 — End: 2021-04-16

## 2021-04-08 MED ORDER — SUGAMMADEX SODIUM 200 MG/2ML IV SOLN
INTRAVENOUS | Status: DC | PRN
  Administered 2021-04-08: 200 mg via INTRAVENOUS

## 2021-04-08 MED ORDER — MIDAZOLAM HCL 2 MG/2ML IJ SOLN
INTRAMUSCULAR | Status: AC
  Filled 2021-04-08: qty 2

## 2021-04-08 MED ORDER — ACETAMINOPHEN 10 MG/ML IV SOLN: 1000.0000 mg | Freq: Once | INTRAVENOUS | Status: DC | PRN

## 2021-04-08 MED ORDER — FENTANYL CITRATE (PF) 250 MCG/5ML IJ SOLN
INTRAMUSCULAR | Status: AC
  Filled 2021-04-08: qty 5

## 2021-04-08 MED ORDER — FENTANYL CITRATE (PF) 250 MCG/5ML IJ SOLN
INTRAMUSCULAR | Status: DC | PRN
  Administered 2021-04-08: 50 ug via INTRAVENOUS
  Administered 2021-04-08: 100 ug via INTRAVENOUS
  Administered 2021-04-08: 50 ug via INTRAVENOUS

## 2021-04-08 MED ORDER — PROPOFOL 10 MG/ML IV BOLUS
INTRAVENOUS | Status: AC
  Filled 2021-04-08: qty 20

## 2021-04-08 MED ORDER — OXYCODONE HCL 5 MG/5ML PO SOLN: 5.0000 mg | Freq: Once | ORAL | Status: DC | PRN

## 2021-04-08 MED ORDER — LACTATED RINGERS IV SOLN: INTRAVENOUS | Status: DC

## 2021-04-08 MED ORDER — HYDROMORPHONE HCL 1 MG/ML IJ SOLN
0.5000 mg | Freq: Once | INTRAMUSCULAR | Status: AC
  Administered 2021-04-08: 0.5 mg via INTRAVENOUS
  Filled 2021-04-08: qty 1

## 2021-04-08 MED ORDER — CHLORHEXIDINE GLUCONATE 0.12 % MT SOLN: 15.0000 mL | Freq: Once | OROMUCOSAL | Status: DC

## 2021-04-08 MED ORDER — DEXMEDETOMIDINE (PRECEDEX) IN NS 20 MCG/5ML (4 MCG/ML) IV SYRINGE
PREFILLED_SYRINGE | INTRAVENOUS | Status: AC
  Filled 2021-04-08: qty 10

## 2021-04-08 MED ORDER — ONDANSETRON HCL 4 MG/2ML IJ SOLN
4.0000 mg | Freq: Once | INTRAMUSCULAR | Status: AC
  Administered 2021-04-08: 4 mg via INTRAVENOUS
  Filled 2021-04-08: qty 2

## 2021-04-08 MED ORDER — DEXMEDETOMIDINE (PRECEDEX) IN NS 20 MCG/5ML (4 MCG/ML) IV SYRINGE
PREFILLED_SYRINGE | INTRAVENOUS | Status: DC | PRN
  Administered 2021-04-08: 8 ug via INTRAVENOUS

## 2021-04-08 MED ORDER — 0.9 % SODIUM CHLORIDE (POUR BTL) OPTIME
TOPICAL | Status: DC | PRN
  Administered 2021-04-08: 1000 mL

## 2021-04-08 MED ORDER — SCOPOLAMINE 1 MG/3DAYS TD PT72
MEDICATED_PATCH | TRANSDERMAL | Status: DC | PRN
  Administered 2021-04-08: 1 via TRANSDERMAL

## 2021-04-08 MED ORDER — ONDANSETRON HCL 4 MG/2ML IJ SOLN
INTRAMUSCULAR | Status: DC | PRN
  Administered 2021-04-08: 4 mg via INTRAVENOUS

## 2021-04-08 MED ORDER — PROPOFOL 10 MG/ML IV BOLUS
INTRAVENOUS | Status: DC | PRN
  Administered 2021-04-08: 20 mg via INTRAVENOUS
  Administered 2021-04-08: 100 mg via INTRAVENOUS
  Administered 2021-04-08: 30 mg via INTRAVENOUS

## 2021-04-08 MED ORDER — DIPHENHYDRAMINE HCL 50 MG/ML IJ SOLN
INTRAMUSCULAR | Status: AC
  Filled 2021-04-08: qty 1

## 2021-04-08 MED ORDER — PROPOFOL 500 MG/50ML IV EMUL
INTRAVENOUS | Status: DC | PRN
  Administered 2021-04-08: 20 ug/kg/min via INTRAVENOUS

## 2021-04-08 MED ORDER — DEXAMETHASONE SODIUM PHOSPHATE 10 MG/ML IJ SOLN
INTRAMUSCULAR | Status: DC | PRN
  Administered 2021-04-08: 10 mg via INTRAVENOUS

## 2021-04-08 MED ORDER — ROCURONIUM BROMIDE 10 MG/ML (PF) SYRINGE
PREFILLED_SYRINGE | INTRAVENOUS | Status: AC
  Filled 2021-04-08: qty 10

## 2021-04-08 MED ORDER — ROCURONIUM BROMIDE 10 MG/ML (PF) SYRINGE
PREFILLED_SYRINGE | INTRAVENOUS | Status: DC | PRN
Start: 2021-04-08 — End: 2021-04-08
  Administered 2021-04-08: 50 mg via INTRAVENOUS

## 2021-04-08 MED ORDER — ORAL CARE MOUTH RINSE: 15.0000 mL | Freq: Once | OROMUCOSAL | Status: DC

## 2021-04-08 MED ORDER — HYDROMORPHONE HCL 1 MG/ML IJ SOLN: 0.5000 mg | INTRAMUSCULAR | Status: DC | PRN

## 2021-04-08 MED ORDER — SODIUM CHLORIDE 0.9 % IR SOLN
Status: DC | PRN
  Administered 2021-04-08: 1000 mL

## 2021-04-08 MED ORDER — OXYCODONE HCL 5 MG PO TABS: 5.0000 mg | ORAL_TABLET | Freq: Four times a day (QID) | ORAL | 0 refills | Status: DC | PRN

## 2021-04-08 MED ORDER — DEXAMETHASONE SODIUM PHOSPHATE 10 MG/ML IJ SOLN
INTRAMUSCULAR | Status: AC
  Filled 2021-04-08: qty 1

## 2021-04-08 MED ORDER — LIDOCAINE 2% (20 MG/ML) 5 ML SYRINGE
INTRAMUSCULAR | Status: DC | PRN
  Administered 2021-04-08: 60 mg via INTRAVENOUS

## 2021-04-08 MED ORDER — ONDANSETRON HCL 4 MG/2ML IJ SOLN
INTRAMUSCULAR | Status: AC
  Filled 2021-04-08: qty 2

## 2021-04-08 MED ORDER — FENTANYL CITRATE (PF) 100 MCG/2ML IJ SOLN
INTRAMUSCULAR | Status: AC
  Filled 2021-04-08: qty 2

## 2021-04-08 MED ORDER — PROMETHAZINE HCL 25 MG/ML IJ SOLN: 6.2500 mg | INTRAMUSCULAR | Status: DC | PRN

## 2021-04-08 MED ORDER — ONDANSETRON HCL 4 MG/2ML IJ SOLN: 4.0000 mg | Freq: Four times a day (QID) | INTRAMUSCULAR | Status: DC | PRN

## 2021-04-08 MED ORDER — EPHEDRINE 5 MG/ML INJ
INTRAVENOUS | Status: AC
  Filled 2021-04-08: qty 5

## 2021-04-08 MED ORDER — EPHEDRINE SULFATE-NACL 50-0.9 MG/10ML-% IV SOSY
PREFILLED_SYRINGE | INTRAVENOUS | Status: DC | PRN
Start: 2021-04-08 — End: 2021-04-08
  Administered 2021-04-08: 5 mg via INTRAVENOUS

## 2021-04-08 MED ORDER — MIDAZOLAM HCL 2 MG/2ML IJ SOLN
INTRAMUSCULAR | Status: DC | PRN
  Administered 2021-04-08: 2 mg via INTRAVENOUS

## 2021-04-08 MED ORDER — AMISULPRIDE (ANTIEMETIC) 5 MG/2ML IV SOLN
INTRAVENOUS | Status: AC
  Filled 2021-04-08: qty 4

## 2021-04-08 MED ORDER — DIPHENHYDRAMINE HCL 50 MG/ML IJ SOLN
INTRAMUSCULAR | Status: DC | PRN
  Administered 2021-04-08: 12.5 mg via INTRAVENOUS

## 2021-04-08 MED ORDER — PHENYLEPHRINE 40 MCG/ML (10ML) SYRINGE FOR IV PUSH (FOR BLOOD PRESSURE SUPPORT)
PREFILLED_SYRINGE | INTRAVENOUS | Status: AC
  Filled 2021-04-08: qty 10

## 2021-04-08 MED ORDER — PHENYLEPHRINE 40 MCG/ML (10ML) SYRINGE FOR IV PUSH (FOR BLOOD PRESSURE SUPPORT)
PREFILLED_SYRINGE | INTRAVENOUS | Status: DC | PRN
Start: 2021-04-08 — End: 2021-04-08
  Administered 2021-04-08: 80 ug via INTRAVENOUS

## 2021-04-08 MED ORDER — SODIUM CHLORIDE 0.9 % IV SOLN
2.0000 g | Freq: Once | INTRAVENOUS | Status: AC
  Administered 2021-04-08: 2 g via INTRAVENOUS
  Filled 2021-04-08: qty 20

## 2021-04-08 MED ORDER — ACETAMINOPHEN 500 MG PO TABS
500.0000 mg | ORAL_TABLET | Freq: Four times a day (QID) | ORAL | 0 refills | Status: AC | PRN
Start: 2021-04-08 — End: ?

## 2021-04-08 MED ORDER — SODIUM CHLORIDE 0.9 % IV SOLN: INTRAVENOUS | Status: DC

## 2021-04-08 MED ORDER — AMISULPRIDE (ANTIEMETIC) 5 MG/2ML IV SOLN
10.0000 mg | Freq: Once | INTRAVENOUS | Status: AC | PRN
  Administered 2021-04-08: 10 mg via INTRAVENOUS

## 2021-04-08 MED ORDER — SODIUM CHLORIDE 0.9 % IV BOLUS
500.0000 mL | Freq: Once | INTRAVENOUS | Status: AC
  Administered 2021-04-08: 500 mL via INTRAVENOUS

## 2021-04-08 MED ORDER — BUPIVACAINE-EPINEPHRINE (PF) 0.25% -1:200000 IJ SOLN
INTRAMUSCULAR | Status: AC
  Filled 2021-04-08: qty 30

## 2021-04-08 MED ORDER — POTASSIUM CHLORIDE IN NACL 20-0.9 MEQ/L-% IV SOLN
INTRAVENOUS | Status: DC
  Filled 2021-04-08: qty 1000

## 2021-04-08 MED ORDER — CHLORHEXIDINE GLUCONATE 0.12 % MT SOLN
OROMUCOSAL | Status: AC
  Administered 2021-04-08: 15 mL
  Filled 2021-04-08: qty 15

## 2021-04-08 SURGICAL SUPPLY — 37 items
APPLIER CLIP ROT 10 11.4 M/L (STAPLE) ×2
BENZOIN TINCTURE PRP APPL 2/3 (GAUZE/BANDAGES/DRESSINGS) ×2 IMPLANT
BLADE CLIPPER SURG (BLADE) IMPLANT
CANISTER SUCT 3000ML PPV (MISCELLANEOUS) ×2 IMPLANT
CHLORAPREP W/TINT 26 (MISCELLANEOUS) ×2 IMPLANT
CLIP APPLIE ROT 10 11.4 M/L (STAPLE) ×1 IMPLANT
COVER SURGICAL LIGHT HANDLE (MISCELLANEOUS) ×2 IMPLANT
DRSG TEGADERM 2-3/8X2-3/4 SM (GAUZE/BANDAGES/DRESSINGS) ×6 IMPLANT
DRSG TEGADERM 4X4.75 (GAUZE/BANDAGES/DRESSINGS) ×2 IMPLANT
ELECT REM PT RETURN 9FT ADLT (ELECTROSURGICAL) ×2
ELECTRODE REM PT RTRN 9FT ADLT (ELECTROSURGICAL) ×1 IMPLANT
GAUZE SPONGE 2X2 8PLY STRL LF (GAUZE/BANDAGES/DRESSINGS) ×1 IMPLANT
GLOVE SURG ENC MOIS LTX SZ7 (GLOVE) ×2 IMPLANT
GLOVE SURG UNDER POLY LF SZ7.5 (GLOVE) ×2 IMPLANT
GOWN STRL REUS W/ TWL LRG LVL3 (GOWN DISPOSABLE) ×3 IMPLANT
GOWN STRL REUS W/TWL LRG LVL3 (GOWN DISPOSABLE) ×3
KIT BASIN OR (CUSTOM PROCEDURE TRAY) ×2 IMPLANT
KIT TURNOVER KIT B (KITS) ×2 IMPLANT
NS IRRIG 1000ML POUR BTL (IV SOLUTION) ×2 IMPLANT
PAD ARMBOARD 7.5X6 YLW CONV (MISCELLANEOUS) ×2 IMPLANT
POUCH RETRIEVAL ECOSAC 10 (ENDOMECHANICALS) IMPLANT
POUCH RETRIEVAL ECOSAC 10MM (ENDOMECHANICALS) ×1
SCISSORS LAP 5X35 DISP (ENDOMECHANICALS) ×2 IMPLANT
SET IRRIG TUBING LAPAROSCOPIC (IRRIGATION / IRRIGATOR) ×2 IMPLANT
SET TUBE SMOKE EVAC HIGH FLOW (TUBING) ×2 IMPLANT
SLEEVE ENDOPATH XCEL 5M (ENDOMECHANICALS) ×2 IMPLANT
SPECIMEN JAR SMALL (MISCELLANEOUS) ×2 IMPLANT
SPONGE GAUZE 2X2 STER 10/PKG (GAUZE/BANDAGES/DRESSINGS) ×1
STRIP CLOSURE SKIN 1/2X4 (GAUZE/BANDAGES/DRESSINGS) ×2 IMPLANT
SUT MNCRL AB 4-0 PS2 18 (SUTURE) ×2 IMPLANT
TOWEL GREEN STERILE (TOWEL DISPOSABLE) ×2 IMPLANT
TOWEL GREEN STERILE FF (TOWEL DISPOSABLE) ×2 IMPLANT
TRAY LAPAROSCOPIC MC (CUSTOM PROCEDURE TRAY) ×2 IMPLANT
TROCAR XCEL BLUNT TIP 100MML (ENDOMECHANICALS) ×2 IMPLANT
TROCAR XCEL NON-BLD 11X100MML (ENDOMECHANICALS) ×2 IMPLANT
TROCAR XCEL NON-BLD 5MMX100MML (ENDOMECHANICALS) ×2 IMPLANT
WATER STERILE IRR 1000ML POUR (IV SOLUTION) ×2 IMPLANT

## 2021-04-08 NOTE — Op Note (Signed)
Laparoscopic Cholecystectomy Procedure Note  Indications: This patient presents with symptomatic gallbladder disease and will undergo laparoscopic cholecystectomy.  Pre-operative Diagnosis: Calculus of gallbladder with other cholecystitis, without mention of obstruction  Post-operative Diagnosis: Same  Surgeon: Jo [name]   Assistants: Bailey Mech, PA-C  Anesthesia: General endotracheal anesthesia  ASA Class: 1  Procedure Details  The patient was seen again in the Holding Room. The risks, benefits, complications, treatment options, and expected outcomes were discussed with the patient. The possibilities of reaction to medication, pulmonary aspiration, perforation of viscus, bleeding, recurrent infection, finding a normal gallbladder, the need for additional procedures, failure to diagnose a condition, the possible need to convert to an open procedure, and creating a complication requiring transfusion or operation were discussed with the patient. The likelihood of improving the patient's symptoms with return to their baseline status is good.  The patient and/or family concurred with the proposed plan, giving informed consent. The site of surgery properly noted. The patient was taken to Operating Room, identified as Jo [name] and the procedure verified as Laparoscopic Cholecystectomy with Intraoperative Cholangiogram. A Time Out was held and the above information confirmed.  Prior to the induction of general anesthesia, antibiotic prophylaxis was administered. General endotracheal anesthesia was then administered and tolerated well. After the induction, the abdomen was prepped with Chloraprep and draped in sterile fashion. The patient was positioned in the supine position.  Local anesthetic agent was injected into the skin below the umbilicus and an incision made. We dissected down to the abdominal fascia with blunt dissection.  The fascia was incised vertically and we entered the  peritoneal cavity bluntly.  A pursestring suture of 0-Vicryl was placed around the fascial opening.  The Hasson cannula was inserted and secured with the stay suture.  Pneumoperitoneum was then created with CO2 and tolerated well without any adverse changes in the patient's vital signs. An 11-mm port was placed in the subxiphoid position.  Two 5-mm ports were placed in the right upper quadrant. All skin incisions were infiltrated with a local anesthetic agent before making the incision and placing the trocars.   We positioned the patient in reverse Trendelenburg, tilted slightly to the patient's left.  The gallbladder was identified, the fundus grasped and retracted cephalad. There were minimal adhesions to the fundus of the gallbladder.  Adhesions were lysed bluntly and with the electrocautery where indicated, taking care not to injure any adjacent organs or viscus. The infundibulum was grasped and retracted laterally, exposing the peritoneum overlying the triangle of Calot. This was then divided and exposed in a blunt fashion. The cystic duct was clearly identified and bluntly dissected circumferentially. A critical view of the cystic duct and cystic artery was obtained.  The cystic duct was then ligated with clips and divided. The cystic artery was, dissected free, ligated with clips and divided as well.   The gallbladder was dissected from the liver bed in retrograde fashion with the electrocautery. The gallbladder was removed and placed in an Endocatch sac. The liver bed was irrigated and inspected. Hemostasis was achieved with the electrocautery. Copious irrigation was utilized and was repeatedly aspirated until clear.  The gallbladder and Endocatch sac were then removed through the umbilical port site.  The pursestring suture was used to close the umbilical fascia.    We again inspected the right upper quadrant for hemostasis.  Pneumoperitoneum was released as we removed the trocars.  4-0 Monocryl was  used to close the skin.   Benzoin, steri-strips, and clean  dressings were applied. The patient was then extubated and brought to the recovery room in stable condition. Instrument, sponge, and needle counts were correct at closure and at the conclusion of the case.   Findings: Cholecystitis with Cholelithiasis  Estimated Blood Loss: Minimal         Drains: None         Specimens: Gallbladder           Complications: None; patient tolerated the procedure well.         Disposition: PACU - hemodynamically stable.         Condition: stable  [name]. [name], MD, [facility]  General Surgery   04/08/2021 12:28 PM

## 2021-04-08 NOTE — ED Notes (Signed)
Pt ambulated to bathroom 

## 2021-04-08 NOTE — H&P (Signed)
Reason for Consult:abdominal pain Referring Provider: [name] is an 31 y.o. female.  HPI: 31 yo female with 1 week of intermittent epigastric pain. Pain comes on randomly. It is intense. It does not radiate. She has been to the ED 2 times for the pain. She has nausea and vomiting with the pain. It is not related to food. She is a vegan. She denies diarrhea.  Past Medical History:  Diagnosis Date   Headache    Heart murmur    bicuspid aorta valve    Past Surgical History:  Procedure Laterality Date   AORTIC VALVE SURGERY  1994   Stated in Prenatal Record that she had surgery as baby to repair valve, but told our MD  the valve corrected itself   MOUTH SURGERY      Family History  Problem Relation Age of Onset   Cancer Mother        cervical   Miscarriages / Stillbirths Mother        3 miscarriages   Arthritis Mother    Depression Mother    Learning disabilities Brother        ADHD   Asthma Brother    Birth defects Brother    Cancer Maternal Aunt        cervical   Mental illness Maternal Aunt    Drug abuse Maternal Aunt    Diabetes Paternal Aunt    Drug abuse Paternal Aunt    Diabetes Maternal Grandmother    Heart disease Maternal Grandmother    Hypertension Maternal Grandmother    Heart disease Maternal Grandfather    Alcohol abuse Maternal Grandfather    Alcohol abuse Father    Drug abuse Father    Mental illness Father    Heart disease Paternal Grandmother    Anesthesia problems Neg Hx    Hypotension Neg Hx    Malignant hyperthermia Neg Hx    Pseudochol deficiency Neg Hx     Social History:  reports that she has never smoked. She has never used smokeless tobacco. She reports that she does not drink alcohol and does not use drugs.  Allergies:  Allergies  Allergen Reactions   Lemon Flavor Nausea And Vomiting   Orange Flavor Nausea And Vomiting   Amoxicillin    Aspirin Other (See Comments)    Pt states that the drug does not  "break-down"  in her body.    Medications: I have reviewed the patient's current medications.  Results for orders placed or performed during the hospital encounter of 04/08/21 (from the past 48 hour(s))  Urinalysis, Routine w reflex microscopic Urine, Clean Catch     Status: Abnormal   Collection Time: 04/08/21  4:28 AM  Result Value Ref Range   Color, Urine YELLOW YELLOW   APPearance CLEAR CLEAR   Specific Gravity, Urine 1.015 1.005 - 1.030   pH 8.5 (H) 5.0 - 8.0   Glucose, UA NEGATIVE NEGATIVE mg/dL   Hgb urine dipstick NEGATIVE NEGATIVE   Bilirubin Urine NEGATIVE NEGATIVE   Ketones, ur NEGATIVE NEGATIVE mg/dL   Protein, ur NEGATIVE NEGATIVE mg/dL   Nitrite NEGATIVE NEGATIVE   Leukocytes,Ua NEGATIVE NEGATIVE    Comment: Microscopic not done on urines with negative protein, blood, leukocytes, nitrite, or glucose < 500 mg/dL. Performed at [facility], [address] [place], [place] [address]   Lipase, blood     Status: None   Collection Time: 04/08/21  4:42 AM  Result Value Ref Range  Lipase 38 11 - 51 U/L    Comment: Performed at [facility], [address], [place], [place] [address]  Comprehensive metabolic panel     Status: Abnormal   Collection Time: 04/08/21  4:42 AM  Result Value Ref Range   Sodium 139 135 - 145 mmol/L   Potassium 3.5 3.5 - 5.1 mmol/L   Chloride 103 98 - 111 mmol/L   CO2 29 22 - 32 mmol/L   Glucose, Bld 116 (H) 70 - 99 mg/dL    Comment: Glucose reference range applies only to samples taken after fasting for at least 8 hours.   BUN 9 6 - 20 mg/dL   Creatinine, Ser 6.04 0.44 - 1.00 mg/dL   Calcium 8.9 8.9 - 54.0 mg/dL   Total Protein 6.5 6.5 - 8.1 g/dL   Albumin 3.9 3.5 - 5.0 g/dL   AST 981 (H) 15 - 41 U/L   ALT 168 (H) 0 - 44 U/L   Alkaline Phosphatase 72 38 - 126 U/L   Total Bilirubin 0.7 0.3 - 1.2 mg/dL   GFR, Estimated >19 >14 mL/min    Comment: (NOTE) Calculated using the CKD-EPI Creatinine Equation (2021)    Anion gap 7 5  - 15    Comment: Performed at [facility], [address], [place], [place] [address]  CBC     Status: None   Collection Time: 04/08/21  4:42 AM  Result Value Ref Range   WBC 7.0 4.0 - 10.5 K/uL   RBC 4.45 3.87 - 5.11 MIL/uL   Hemoglobin 12.8 12.0 - 15.0 g/dL   HCT 62.1 30.8 - 65.7 %   MCV 87.0 80.0 - 100.0 fL   MCH 28.8 26.0 - 34.0 pg   MCHC 33.1 30.0 - 36.0 g/dL   RDW 84.6 96.2 - 95.2 %   Platelets 180 150 - 400 K/uL   nRBC 0.0 0.0 - 0.2 %    Comment: Performed at [facility], [address] [place], [place] [address]  I-Stat beta hCG blood, ED     Status: None   Collection Time: 04/08/21  4:56 AM  Result Value Ref Range   I-stat hCG, quantitative <5.0 <5 mIU/mL   Comment 3            Comment:   GEST. AGE      CONC.  (mIU/mL)   <=1 WEEK        5 - 50     2 WEEKS       50 - 500     3 WEEKS       100 - 10,000     4 WEEKS     1,000 - 30,000        FEMALE AND NON-PREGNANT FEMALE:     LESS THAN 5 mIU/mL   Resp Panel by RT-PCR (Flu A&B, Covid) Nasopharyngeal Swab     Status: None   Collection Time: 04/08/21  5:39 AM   Specimen: Nasopharyngeal Swab; Nasopharyngeal(NP) swabs in vial transport medium  Result Value Ref Range   SARS Coronavirus 2 by RT PCR NEGATIVE NEGATIVE    Comment: (NOTE) SARS-CoV-2 target nucleic acids are NOT DETECTED.  The SARS-CoV-2 RNA is generally detectable in upper respiratory specimens during the acute phase of infection. The lowest concentration of SARS-CoV-2 viral copies this assay can detect is 138 copies/mL. A negative result does not preclude SARS-Cov-2 infection and should not be used as the sole basis for treatment or  other patient management decisions. A negative result may occur with  improper specimen collection/handling, submission of specimen other than nasopharyngeal swab, presence of viral mutation(s) within the areas targeted by this assay, and inadequate number of viral copies(<138 copies/mL). A negative result must be  combined with clinical observations, patient history, and epidemiological information. The expected result is Negative.  Fact Sheet for Patients:  BloggerCourse.comhttps://www.fda.gov/media/152166/download  Fact Sheet for Healthcare Providers:  SeriousBroker.ithttps://www.fda.gov/media/152162/download  This test is no t yet approved or cleared by the [place] FDA and  has been authorized for detection and/or diagnosis of SARS-CoV-2 by FDA under an Emergency Use Authorization (EUA). This EUA will remain  in effect (meaning this test can be used) for the duration of the COVID-19 declaration under Section 564(b)(1) of the Act, 21 U.S.C.section 360bbb-3(b)(1), unless the authorization is terminated  or revoked sooner.       Influenza A by PCR NEGATIVE NEGATIVE   Influenza B by PCR NEGATIVE NEGATIVE    Comment: (NOTE) The Xpert Xpress SARS-CoV-2/FLU/RSV plus assay is intended as an aid in the diagnosis of influenza from Nasopharyngeal swab specimens and should not be used as a sole basis for treatment. Nasal washings and aspirates are unacceptable for Xpert Xpress SARS-CoV-2/FLU/RSV testing.  Fact Sheet for Patients: BloggerCourse.comhttps://www.fda.gov/media/152166/download  Fact Sheet for Healthcare Providers: SeriousBroker.ithttps://www.fda.gov/media/152162/download  This test is not yet approved or cleared by the [place] FDA and has been authorized for detection and/or diagnosis of SARS-CoV-2 by FDA under an Emergency Use Authorization (EUA). This EUA will remain in effect (meaning this test can be used) for the duration of the COVID-19 declaration under Section 564(b)(1) of the Act, 21 U.S.C. section 360bbb-3(b)(1), unless the authorization is terminated or revoked.  Performed at [facility], [address] [place], [place] [id number]     CT Renal Stone Study  Result Date: 04/06/2021 CLINICAL DATA:  Sudden onset right upper quadrant abdominal pain radiating to the mid abdomen EXAM: CT ABDOMEN AND PELVIS WITHOUT  CONTRAST TECHNIQUE: Multidetector CT imaging of the abdomen and pelvis was performed following the standard protocol without IV contrast. RADIATION DOSE REDUCTION: This exam was performed according to the departmental dose-optimization program which includes automated exposure control, adjustment of the mA and/or kV according to patient size and/or use of iterative reconstruction technique. COMPARISON:  None. FINDINGS: Lower chest: No acute abnormality. Hepatobiliary: Unremarkable noncontrast appearance of the hepatic parenchyma. Gallbladder is distended without pericholecystic fluid or radiopaque cholelithiasis. No biliary ductal dilation. Pancreas: No pancreatic ductal dilation or evidence of acute inflammation. Spleen: Within normal limits. Adrenals/Urinary Tract: Bilateral adrenal glands appear normal. No hydronephrosis. Punctate nonobstructive right upper pole renal stone. No obstructive ureteral or bladder calculi identified. Urinary bladder is unremarkable for degree of distension. Stomach/Bowel: No enteric contrast was administered. Stomach is moderately distended with ingested material without wall thickening. No pathologic dilation of small or large bowel. Terminal ileum appears normal. The appendix is not confidently identified however there is no pericecal inflammation. Moderate volume of formed stool throughout the colon suggestive of constipation. Vascular/Lymphatic: No significant vascular findings are present. No enlarged abdominal or pelvic lymph nodes. Reproductive: Unremarkable non contrasted premenopausal CT appearance of the uterus and bilateral adnexa. Other: Trace pelvic free fluid is within normal limits for a reproductive age female. Musculoskeletal: Mild L5-S1 discogenic disease. No acute osseous abnormality. IMPRESSION: 1. Punctate nonobstructive right upper pole renal stone. No obstructive ureteral or bladder calculi identified. 2. Dilated gallbladder without pericholecystic fluid or  radiopaque cholelithiasis. Recommend correlation with  right upper quadrant tenderness to palpation. 3. Moderate volume of formed stool throughout the colon suggestive of constipation. Electronically Signed   By: Maudry MayhewJeffrey  Waltz M.D.   On: 04/06/2021 14:31    ROS  PE Blood pressure 103/78, pulse (!) 55, temperature 97.6 F (36.4 C), temperature source Oral, resp. rate 15, height 4\' 11"  (1.499 m), weight 52.2 kg, last menstrual period 03/15/2021, SpO2 100 %, unknown if currently breastfeeding. Constitutional: NAD; conversant; no deformities Eyes: Moist conjunctiva; no lid lag; anicteric; PERRL Neck: Trachea midline; no thyromegaly Lungs: Normal respiratory effort; no tactile fremitus CV: RRR; no palpable thrills; no pitting edema GI: Abd slight tenderness in RUQ; no palpable hepatosplenomegaly MSK: Normal gait; no clubbing/cyanosis Psychiatric: Appropriate affect; alert and oriented x3 Lymphatic: No palpable cervical or axillary lymphadenopathy Skin: No major subcutaneous nodules. Warm and dry   Assessment/Plan: 31 yo female with RUQ pain, improved after medication. CT showing edema and inflammatory changes around gallbladder without stones. LFTs slightly elevated -US to further evaluate gallbladder -once US completely, likely admit to surgery with plan for lap chole -We discussed the etiology of her pain, we discussed treatment options and recommended surgery. We discussed details of surgery including general anesthesia, laparoscopic approach, identification of cystic duct and common bile duct. Ligation of cystic duct and cystic artery. Possible need for intraoperative cholangiogram or open procedure. Possible risks of common bile duct injury, liver injury, cystic duct leak, bleeding, infection, post-cholecystectomy syndrome. The patient showed good understanding and all questions were answered   I reviewed nursing notes, ED provider notes, last 24 h vitals and pain scores, last 48 h intake  and output, last 24 h labs and trends, and last 24 h imaging results.  This care required high  level of medical decision making.   De BlanchLuke Aaron Jesselyn Rask 04/08/2021, 6:42 AM

## 2021-04-08 NOTE — ED Provider Notes (Signed)
[facility] EMERGENCY DEPARTMENT Provider Note   CSN: [id number] Arrival date & time: 04/08/21  0417     History  Chief Complaint  Patient presents with   Abdominal Pain    Jo [name] is a 31 y.o. female.  The history is provided by the patient and medical records.  Abdominal Pain Jo [name] is a 31 y.o. female who presents to the Emergency Department complaining of abdominal pain.  She presents to the ED for evaluation of RUQ abdominal pain.  Pain initially started on Saturday and was seen in the ED.  She was treated and felt improved at time of discharge. This morning she woke at 250 with dull RUQ pain.  Pain is constant and nonradiating.  Has assocaited N/V.    No fever, diarrhea.      Home Medications Prior to Admission medications   Medication Sig Start Date End Date Taking? Authorizing Provider  aspirin-acetaminophen-caffeine (EXCEDRIN MIGRAINE) [phone] MG tablet Take 1 tablet by mouth every 6 (six) hours as needed for headache.   Yes [provider]  amoxicillin-clavulanate (AUGMENTIN) 875-125 MG tablet Take 1 tablet by mouth every 12 (twelve) hours. Patient not taking: Reported on 04/08/2021 06/17/16   Glendell Docker, NP  diclofenac (VOLTAREN) 50 MG EC tablet Take 1 tablet (50 mg total) by mouth 2 (two) times daily. Patient not taking: Reported on 04/08/2021 11/06/15   Ashley Murrain, NP  ibuprofen (ADVIL,MOTRIN) 800 MG tablet Take 1 tablet (800 mg total) by mouth every 6 (six) hours as needed for moderate pain or cramping. Patient not taking: Reported on 04/08/2021 05/23/15   Jerelyn Charles, MD  methocarbamol (ROBAXIN) 500 MG tablet Take 1 tablet (500 mg total) by mouth 2 (two) times daily. Patient not taking: Reported on 04/08/2021 03/28/17   Providence Lanius A, PA-C  ondansetron (ZOFRAN) 4 MG tablet Take 1 tablet (4 mg total) by mouth every 8 (eight) hours as needed for up to 12 doses for nausea or vomiting. Patient not taking: Reported on  04/08/2021 04/06/21   Lennice Sites, DO  Prenatal Vit-Fe Fumarate-FA (PRENATAL MULTIVITAMIN) TABS tablet Take 1 tablet by mouth daily at 12 noon. Patient not taking: Reported on 04/08/2021    [provider]      Allergies    Lemon flavor, Orange flavor, Amoxicillin, and Aspirin    Review of Systems   Review of Systems  Gastrointestinal:  Positive for abdominal pain.  All other systems reviewed and are negative.  Physical Exam Updated Vital Signs BP 98/70    Pulse (!) 58    Temp 97.6 F (36.4 C) (Oral)    Resp 15    Ht 4\' 11"  (1.499 m)    Wt 52.2 kg    LMP 03/15/2021    SpO2 98%    BMI 23.23 kg/m  Physical Exam Vitals and nursing note reviewed.  Constitutional:      Appearance: She is well-developed.  HENT:     Head: Normocephalic and atraumatic.  Cardiovascular:     Rate and Rhythm: Normal rate and regular rhythm.     Heart sounds: No murmur heard. Pulmonary:     Effort: Pulmonary effort is normal. No respiratory distress.     Breath sounds: Normal breath sounds.  Abdominal:     Palpations: Abdomen is soft.     Tenderness: There is no guarding or rebound.     Comments: Moderate right upper quadrant tenderness, voluntary guarding.  Musculoskeletal:        General:  No tenderness.  Skin:    General: Skin is warm and dry.  Neurological:     Mental Status: She is alert and oriented to person, place, and time.  Psychiatric:        Behavior: Behavior normal.    ED Results / Procedures / Treatments   Labs (all labs ordered are listed, but only abnormal results are displayed) Labs Reviewed  COMPREHENSIVE METABOLIC PANEL - Abnormal; Notable for the following components:      Result Value   Glucose, Bld 116 (*)    AST 116 (*)    ALT 168 (*)    All other components within normal limits  URINALYSIS, ROUTINE W REFLEX MICROSCOPIC - Abnormal; Notable for the following components:   pH 8.5 (*)    All other components within normal limits  RESP PANEL BY RT-PCR (FLU A&B,  COVID) ARPGX2  LIPASE, BLOOD  CBC  I-STAT BETA HCG BLOOD, ED ([facility], [facility], [facility] ONLY)    EKG None  Radiology CT Renal Stone Study  Result Date: 04/06/2021 CLINICAL DATA:  Sudden onset right upper quadrant abdominal pain radiating to the mid abdomen EXAM: CT ABDOMEN AND PELVIS WITHOUT CONTRAST TECHNIQUE: Multidetector CT imaging of the abdomen and pelvis was performed following the standard protocol without IV contrast. RADIATION DOSE REDUCTION: This exam was performed according to the departmental dose-optimization program which includes automated exposure control, adjustment of the mA and/or kV according to patient size and/or use of iterative reconstruction technique. COMPARISON:  None. FINDINGS: Lower chest: No acute abnormality. Hepatobiliary: Unremarkable noncontrast appearance of the hepatic parenchyma. Gallbladder is distended without pericholecystic fluid or radiopaque cholelithiasis. No biliary ductal dilation. Pancreas: No pancreatic ductal dilation or evidence of acute inflammation. Spleen: Within normal limits. Adrenals/Urinary Tract: Bilateral adrenal glands appear normal. No hydronephrosis. Punctate nonobstructive right upper pole renal stone. No obstructive ureteral or bladder calculi identified. Urinary bladder is unremarkable for degree of distension. Stomach/Bowel: No enteric contrast was administered. Stomach is moderately distended with ingested material without wall thickening. No pathologic dilation of small or large bowel. Terminal ileum appears normal. The appendix is not confidently identified however there is no pericecal inflammation. Moderate volume of formed stool throughout the colon suggestive of constipation. Vascular/Lymphatic: No significant vascular findings are present. No enlarged abdominal or pelvic lymph nodes. Reproductive: Unremarkable non contrasted premenopausal CT appearance of the uterus and bilateral adnexa. Other: Trace pelvic free fluid is within normal limits for  a reproductive age female. Musculoskeletal: Mild L5-S1 discogenic disease. No acute osseous abnormality. IMPRESSION: 1. Punctate nonobstructive right upper pole renal stone. No obstructive ureteral or bladder calculi identified. 2. Dilated gallbladder without pericholecystic fluid or radiopaque cholelithiasis. Recommend correlation with right upper quadrant tenderness to palpation. 3. Moderate volume of formed stool throughout the colon suggestive of constipation. Electronically Signed   By: Dahlia Bailiff M.D.   On: 04/06/2021 14:31    Procedures Procedures    Medications Ordered in ED Medications  fentaNYL (SUBLIMAZE) injection 50 mcg (50 mcg Intravenous Not Given 04/08/21 0538)  sodium chloride 0.9 % bolus 500 mL (0 mLs Intravenous Stopped 04/08/21 0658)  ondansetron (ZOFRAN) injection 4 mg (4 mg Intravenous Given 04/08/21 0536)  HYDROmorphone (DILAUDID) injection 0.5 mg (0.5 mg Intravenous Given 04/08/21 0555)    ED Course/ Medical Decision Making/ A&P                           Medical Decision Making Amount and/or Complexity of Data Reviewed Labs:  ordered.  Risk Prescription drug management. Decision regarding hospitalization.   Patient here for evaluation of right upper quadrant abdominal pain, seen yesterday in the emergency department for similar symptoms but her pain had resolved.  Initial CT scan yesterday was concerning for cholecystitis.  Physical examination at time of ED presentation concerning as well for cholecystitis with focal right upper quadrant tenderness.  LFTs are uptrending compared to yesterday's.  CBC is within normal limits.  Will treat pain.  Discussed with Dr. Kieth Brightly with general surgery who agrees to see the patient in consult.  She has been evaluated by the surgeon, recommendation for right upper quadrant ultrasound to further evaluate gallbladder pathology.  She will be then reevaluated by surgery team.  Patient care transferred pending  ultrasound.        Final Clinical Impression(s) / ED Diagnoses Final diagnoses:  Acute cholecystitis    Rx / DC Orders ED Discharge Orders     None         Quintella Reichert, MD 04/08/21 [phone]

## 2021-04-08 NOTE — Progress Notes (Signed)
Patient ID: Jo [name], female   DOB: Apr 05, 1990, 31 y.o.   MRN: [id number]  CLINICAL DATA:  Abdominal pain.   EXAM: ULTRASOUND ABDOMEN LIMITED RIGHT UPPER QUADRANT   COMPARISON:  CT abdomen and pelvis 04/06/2021   FINDINGS: Gallbladder:   Sludge and multiple small gallstones measuring up to 6 mm in size. No gallbladder wall thickening. No sonographic [name] sign noted by sonographer.   Common bile duct:   Diameter: 4 mm   Liver:   Slightly increased parenchymal echogenicity diffusely without a focal lesion identified. Portal vein is patent on color Doppler imaging with normal direction of blood flow towards the liver.   Other: None.   IMPRESSION: 1. Gallbladder sludge and small stones without evidence of acute cholecystitis. 2. No biliary dilatation. 3. Mildly echogenic liver, nonspecific though may reflect steatosis.     Electronically Signed   By: Sebastian Ache M.D.   On: 04/08/2021 09:27   US shows signs of cholelithiasis, consistent with her symptoms. Will proceed with laparoscopic cholecystectomy today.  The surgical procedure has been discussed with the patient.  Potential risks, benefits, alternative treatments, and expected outcomes have been explained.  All of the patient's questions at this time have been answered.  The likelihood of reaching the patient's treatment goal is good.  The patient understand the proposed surgical procedure and wishes to proceed.  Will probably discharge patient post-op.  [name]. [name], MD, [facility]  General Surgery   04/08/2021 11:08 AM

## 2021-04-08 NOTE — Anesthesia Postprocedure Evaluation (Signed)
Anesthesia Post Note  Patient: Jo [name]  Procedure(s) Performed: LAPAROSCOPIC CHOLECYSTECTOMY (Abdomen)     Patient location during evaluation: PACU Anesthesia Type: General Level of consciousness: awake Pain management: pain level controlled Vital Signs Assessment: post-procedure vital signs reviewed and stable Respiratory status: spontaneous breathing, nonlabored ventilation, respiratory function stable and patient connected to nasal cannula oxygen Cardiovascular status: blood pressure returned to baseline and stable Postop Assessment: no apparent nausea or vomiting Anesthetic complications: no   No notable events documented.  Last Vitals:  Vitals:   04/08/21 1447 04/08/21 1502  BP: 116/75 117/84  Pulse: 68 67  Resp: 17 13  Temp:  36.7 C  SpO2: 97% 97%    Last Pain:  Vitals:   04/08/21 1431  TempSrc:   PainSc: 10-Worst pain ever                 Klaus Casteneda P Demarquez Ciolek

## 2021-04-08 NOTE — Anesthesia Preprocedure Evaluation (Addendum)
Anesthesia Evaluation  Patient identified by MRN, date of birth, ID band Patient awake    Reviewed: Allergy & Precautions, NPO status , Patient's Chart, lab work & pertinent test results  History of Anesthesia Complications (+) PONV and history of anesthetic complications  Airway Mallampati: II  TM Distance: >3 FB Neck ROM: Full    Dental no notable dental hx.    Pulmonary neg pulmonary ROS,    Pulmonary exam normal breath sounds clear to auscultation       Cardiovascular negative cardio ROS Normal cardiovascular exam Rhythm:Regular Rate:Normal  ECG: SB, rate 55   Neuro/Psych  Headaches, negative psych ROS   GI/Hepatic negative GI ROS, Neg liver ROS,   Endo/Other  negative endocrine ROS  Renal/GU negative Renal ROS     Musculoskeletal negative musculoskeletal ROS (+)   Abdominal   Peds  Hematology negative hematology ROS (+)   Anesthesia Other Findings Symptomatic cholelithiasis  Reproductive/Obstetrics                            Anesthesia Physical Anesthesia Plan  ASA: 2  Anesthesia Plan: General   Post-op Pain Management:    Induction: Intravenous  PONV Risk Score and Plan: 3 and Ondansetron, Dexamethasone, Midazolam, Treatment may vary due to age or medical condition, Scopolamine patch - Pre-op and Propofol infusion  Airway Management Planned: Oral ETT  Additional Equipment:   Intra-op Plan:   Post-operative Plan: Extubation in OR  Informed Consent: I have reviewed the patients History and Physical, chart, labs and discussed the procedure including the risks, benefits and alternatives for the proposed anesthesia with the patient or authorized representative who has indicated his/her understanding and acceptance.     Dental advisory given  Plan Discussed with: CRNA  Anesthesia Plan Comments:        Anesthesia Quick Evaluation

## 2021-04-08 NOTE — ED Triage Notes (Signed)
Pt presents to the ED from home with complaints of RUQ and flank pain onset Saturday, was seen here for same and D/C. Tonight pain is worse. Pt also has N/V, took Zofran PTA. Pt denies urinary symptoms.

## 2021-04-08 NOTE — Transfer of Care (Signed)
Immediate Anesthesia Transfer of Care Note  Patient: Araiya Tramonte  Procedure(s) Performed: LAPAROSCOPIC CHOLECYSTECTOMY (Abdomen)  Patient Location: PACU  Anesthesia Type:General  Level of Consciousness: drowsy  Airway & Oxygen Therapy: Patient Spontanous Breathing and Patient connected to nasal cannula oxygen  Post-op Assessment: Report given to RN and Post -op Vital signs reviewed and stable  Post vital signs: Reviewed and stable  Last Vitals:  Vitals Value Taken Time  BP    Temp    Pulse 71 04/08/21 1302  Resp 16 04/08/21 1302  SpO2 99 % 04/08/21 1302  Vitals shown include unvalidated device data.  Last Pain:  Vitals:   04/08/21 1109  TempSrc: Oral  PainSc:       Patients Stated Pain Goal: 1 ([id number])  Complications: No notable events documented.

## 2021-04-08 NOTE — Anesthesia Procedure Notes (Signed)
Procedure Name: Intubation Date/Time: 04/08/2021 11:47 AM Performed by: Vonna Drafts, CRNA Pre-anesthesia Checklist: Patient identified, Emergency Drugs available, Suction available and Patient being monitored Patient Re-evaluated:Patient Re-evaluated prior to induction Oxygen Delivery Method: Circle system utilized Preoxygenation: Pre-oxygenation with 100% oxygen Induction Type: IV induction Ventilation: Mask ventilation without difficulty Laryngoscope Size: Mac and 3 Grade View: Grade I Tube type: Oral Tube size: 7.0 mm Number of attempts: 1 Airway Equipment and Method: Stylet and Oral airway Placement Confirmation: ETT inserted through vocal cords under direct vision, positive ETCO2 and breath sounds checked- equal and bilateral Secured at: 21 cm Tube secured with: Tape Dental Injury: Teeth and Oropharynx as per pre-operative assessment

## 2021-04-08 NOTE — Discharge Instructions (Addendum)
[facility]  Please arrive at least 30 min before your appointment to complete your check in paperwork.  If you are unable to arrive 30 min prior to your appointment time we may have to cancel or reschedule you. LAPAROSCOPIC SURGERY: POST OP INSTRUCTIONS Always review your discharge instruction sheet given to you by the facility where your surgery was performed. IF YOU HAVE DISABILITY OR FAMILY LEAVE FORMS, YOU MUST BRING THEM TO THE OFFICE FOR PROCESSING.   DO NOT GIVE THEM TO YOUR DOCTOR.  PAIN CONTROL  First take acetaminophen (Tylenol) AND/or ibuprofen (Advil) to control your pain after surgery.  Follow directions on package.  Taking acetaminophen (Tylenol) and/or ibuprofen (Advil) regularly after surgery will help to control your pain and lower the amount of prescription pain medication you may need.  You should not take more than 4,000 mg (4 grams) of acetaminophen (Tylenol) in 24 hours.  You should not take ibuprofen (Advil), aleve, motrin, naprosyn or other NSAIDS if you have a history of stomach ulcers or chronic kidney disease.  A prescription for pain medication may be given to you upon discharge.  Take your pain medication as prescribed, if you still have uncontrolled pain after taking acetaminophen (Tylenol) or ibuprofen (Advil). Use ice packs to help control pain. If you need a refill on your pain medication, please contact your pharmacy.  They will contact our office to request authorization. Prescriptions will not be filled after 5pm or on week-ends.  HOME MEDICATIONS Take your usually prescribed medications unless otherwise directed.  DIET You should follow a light diet the first few days after arrival home.  Be sure to include lots of fluids daily. Avoid fatty, fried foods.   CONSTIPATION It is common to experience some constipation after surgery and if you are taking pain medication.  Increasing fluid intake and taking a stool softener (such as Colace)  will usually help or prevent this problem from occurring.  A mild laxative (Milk of Magnesia or Miralax) should be taken according to package instructions if there are no bowel movements after 48 hours.  WOUND/INCISION CARE Most patients will experience some swelling and bruising in the area of the incisions.  Ice packs will help.  Swelling and bruising can take several days to resolve.  Unless discharge instructions indicate otherwise, follow guidelines below  STERI-STRIPS - you may remove your outer bandages 48 hours after surgery, and you may shower at that time.  You have steri-strips (small skin tapes) in place directly over the incision.  These strips should be left on the skin for 7-10 days.   DERMABOND/SKIN GLUE - you may shower in 24 hours.  The glue will flake off over the next 2-3 weeks. Any sutures or staples will be removed at the office during your follow-up visit.  ACTIVITIES You may resume regular (light) daily activities beginning the next day--such as daily self-care, walking, climbing stairs--gradually increasing activities as tolerated.  You may have sexual intercourse when it is comfortable.  Refrain from any heavy lifting or straining until approved by your doctor. You may drive when you are no longer taking prescription pain medication, you can comfortably wear a seatbelt, and you can safely maneuver your car and apply brakes.  FOLLOW-UP You should see your doctor in the office for a follow-up appointment approximately 2-3 weeks after your surgery.  You should have been given your post-op/follow-up appointment when your surgery was scheduled.  If you did not receive a post-op/follow-up appointment, make sure   that you call for this appointment within a day or two after you arrive home to insure a convenient appointment time.  OTHER INSTRUCTIONS  WHEN TO CALL YOUR DOCTOR: Fever over 101.0 Inability to urinate Continued bleeding from incision. Increased pain, redness, or  drainage from the incision. Increasing abdominal pain  The clinic staff is available to answer your questions during regular business hours.  Please don't hesitate to call and ask to speak to one of the nurses for clinical concerns.  If you have a medical emergency, go to the nearest emergency room or call 911.  A surgeon from [facility] is always on call at the hospital. [address], [address], [place], [place]  [address] ? [address] [address], [place], [place]   [address] [phone] ? [phone] ? FAX [phone]   

## 2021-04-09 ENCOUNTER — Encounter (HOSPITAL_COMMUNITY): Payer: Self-pay | Admitting: Surgery

## 2021-04-09 LAB — SURGICAL PATHOLOGY

## 2021-04-12 ENCOUNTER — Other Ambulatory Visit: Payer: Self-pay

## 2021-04-12 ENCOUNTER — Observation Stay (HOSPITAL_COMMUNITY): Payer: Medicaid Other

## 2021-04-12 ENCOUNTER — Encounter (HOSPITAL_BASED_OUTPATIENT_CLINIC_OR_DEPARTMENT_OTHER): Payer: Self-pay | Admitting: Obstetrics and Gynecology

## 2021-04-12 ENCOUNTER — Inpatient Hospital Stay (HOSPITAL_BASED_OUTPATIENT_CLINIC_OR_DEPARTMENT_OTHER)
Admission: EM | Admit: 2021-04-12 | Discharge: 2021-04-16 | DRG: 356 | Disposition: A | Discharge status: Home / Self Care | Payer: Medicaid Other | Source: Ambulatory Visit | Attending: Surgery | Admitting: Surgery

## 2021-04-12 ENCOUNTER — Emergency Department (HOSPITAL_BASED_OUTPATIENT_CLINIC_OR_DEPARTMENT_OTHER): Payer: Medicaid Other

## 2021-04-12 ENCOUNTER — Emergency Department (HOSPITAL_BASED_OUTPATIENT_CLINIC_OR_DEPARTMENT_OTHER): Payer: Medicaid Other | Admitting: Radiology

## 2021-04-12 DIAGNOSIS — Z9049 Acquired absence of other specified parts of digestive tract: Secondary | ICD-10-CM

## 2021-04-12 DIAGNOSIS — Z886 Allergy status to analgesic agent status: Secondary | ICD-10-CM

## 2021-04-12 DIAGNOSIS — K838 Other specified diseases of biliary tract: Secondary | ICD-10-CM | POA: Diagnosis present

## 2021-04-12 DIAGNOSIS — Z880 Allergy status to penicillin: Secondary | ICD-10-CM

## 2021-04-12 DIAGNOSIS — Z20822 Contact with and (suspected) exposure to covid-19: Secondary | ICD-10-CM | POA: Diagnosis present

## 2021-04-12 DIAGNOSIS — Y836 Removal of other organ (partial) (total) as the cause of abnormal reaction of the patient, or of later complication, without mention of misadventure at the time of the procedure: Secondary | ICD-10-CM | POA: Diagnosis present

## 2021-04-12 DIAGNOSIS — K9189 Other postprocedural complications and disorders of digestive system: Principal | ICD-10-CM | POA: Diagnosis present

## 2021-04-12 DIAGNOSIS — R109 Unspecified abdominal pain: Secondary | ICD-10-CM

## 2021-04-12 DIAGNOSIS — G8918 Other acute postprocedural pain: Secondary | ICD-10-CM | POA: Diagnosis present

## 2021-04-12 DIAGNOSIS — K8050 Calculus of bile duct without cholangitis or cholecystitis without obstruction: Secondary | ICD-10-CM | POA: Diagnosis present

## 2021-04-12 DIAGNOSIS — K653 Choleperitonitis: Secondary | ICD-10-CM | POA: Diagnosis present

## 2021-04-12 DIAGNOSIS — Z888 Allergy status to other drugs, medicaments and biological substances status: Secondary | ICD-10-CM

## 2021-04-12 LAB — COMPREHENSIVE METABOLIC PANEL
ALT: 113 U/L — ABNORMAL HIGH (ref 0–44)
AST: 19 U/L (ref 15–41)
Albumin: 4.4 g/dL (ref 3.5–5.0)
Alkaline Phosphatase: 104 U/L (ref 38–126)
Anion gap: 12 (ref 5–15)
BUN: 16 mg/dL (ref 6–20)
CO2: 31 mmol/L (ref 22–32)
Calcium: 10 mg/dL (ref 8.9–10.3)
Chloride: 95 mmol/L — ABNORMAL LOW (ref 98–111)
Creatinine, Ser: 0.48 mg/dL (ref 0.44–1.00)
GFR, Estimated: >60 mL/min (ref >60)
Glucose, Bld: 104 mg/dL — ABNORMAL HIGH (ref 70–99)
Potassium: 3.7 mmol/L (ref 3.5–5.1)
Sodium: 138 mmol/L (ref 135–145)
Total Bilirubin: 4.1 mg/dL — ABNORMAL HIGH (ref 0.3–1.2)
Total Protein: 7.9 g/dL (ref 6.5–8.1)

## 2021-04-12 LAB — CBC
HCT: 41.8 % (ref 36.0–46.0)
Hemoglobin: 14.4 g/dL (ref 12.0–15.0)
MCH: 28.7 pg (ref 26.0–34.0)
MCHC: 34.4 g/dL (ref 30.0–36.0)
MCV: 83.3 fL (ref 80.0–100.0)
Platelets: 204 10*3/uL (ref 150–400)
RBC: 5.02 MIL/uL (ref 3.87–5.11)
RDW: 11.8 % (ref 11.5–15.5)
WBC: 8.8 10*3/uL (ref 4.0–10.5)
nRBC: 0 % (ref 0.0–0.2)

## 2021-04-12 LAB — LIPASE, BLOOD: Lipase: <10 U/L — ABNORMAL LOW (ref 11–51)

## 2021-04-12 LAB — URINALYSIS, ROUTINE W REFLEX MICROSCOPIC
Glucose, UA: NEGATIVE mg/dL
Ketones, ur: >80 mg/dL — AB
Leukocytes,Ua: NEGATIVE
Nitrite: NEGATIVE
Protein, ur: 30 mg/dL — AB
Specific Gravity, Urine: >1.046 — ABNORMAL HIGH (ref 1.005–1.030)
pH: 6.5 (ref 5.0–8.0)

## 2021-04-12 LAB — HCG, QUANTITATIVE, PREGNANCY: hCG, Beta Chain, Quant, S: <1 m[IU]/mL (ref <5)

## 2021-04-12 LAB — RESP PANEL BY RT-PCR (FLU A&B, COVID) ARPGX2
Influenza A by PCR: NEGATIVE
Influenza B by PCR: NEGATIVE
SARS Coronavirus 2 by RT PCR: NEGATIVE

## 2021-04-12 MED ORDER — OXYCODONE HCL 5 MG PO TABS
5.0000 mg | ORAL_TABLET | ORAL | Status: DC | PRN
  Administered 2021-04-12 – 2021-04-13 (×2): 10 mg via ORAL
  Administered 2021-04-13 (×2): 5 mg via ORAL
  Filled 2021-04-12 (×2): qty 2
  Filled 2021-04-12: qty 1
  Filled 2021-04-12 (×2): qty 2

## 2021-04-12 MED ORDER — METRONIDAZOLE 500 MG/100ML IV SOLN
500.0000 mg | Freq: Once | INTRAVENOUS | Status: AC
  Administered 2021-04-12: 500 mg via INTRAVENOUS

## 2021-04-12 MED ORDER — IOHEXOL 300 MG/ML  SOLN
100.0000 mL | Freq: Once | INTRAMUSCULAR | Status: AC | PRN
  Administered 2021-04-12: 80 mL via INTRAVENOUS

## 2021-04-12 MED ORDER — HYDROMORPHONE HCL 1 MG/ML IJ SOLN
1.0000 mg | INTRAMUSCULAR | Status: DC | PRN
  Administered 2021-04-12 – 2021-04-13 (×4): 1 mg via INTRAVENOUS
  Filled 2021-04-12 (×4): qty 1

## 2021-04-12 MED ORDER — PIPERACILLIN-TAZOBACTAM 3.375 G IVPB
3.3750 g | Freq: Three times a day (TID) | INTRAVENOUS | Status: DC
  Administered 2021-04-12 – 2021-04-14 (×5): 3.375 g via INTRAVENOUS
  Filled 2021-04-12 (×5): qty 50

## 2021-04-12 MED ORDER — SODIUM CHLORIDE 0.9 % IV SOLN
2.0000 g | Freq: Once | INTRAVENOUS | Status: AC
  Administered 2021-04-12: 2 g via INTRAVENOUS
  Filled 2021-04-12: qty 20

## 2021-04-12 MED ORDER — MORPHINE SULFATE (PF) 2 MG/ML IV SOLN
2.0000 mg | INTRAVENOUS | Status: DC | PRN
  Administered 2021-04-13: 2 mg via INTRAVENOUS
  Filled 2021-04-12: qty 1

## 2021-04-12 MED ORDER — ACETAMINOPHEN 500 MG PO TABS
1000.0000 mg | ORAL_TABLET | Freq: Four times a day (QID) | ORAL | Status: DC | PRN
  Administered 2021-04-13 – 2021-04-15 (×3): 1000 mg via ORAL
  Filled 2021-04-12 (×3): qty 2

## 2021-04-12 MED ORDER — HYDROMORPHONE HCL 1 MG/ML IJ SOLN
1.0000 mg | Freq: Once | INTRAMUSCULAR | Status: AC
  Administered 2021-04-12: 1 mg via INTRAVENOUS
  Filled 2021-04-12: qty 1

## 2021-04-12 MED ORDER — ONDANSETRON 4 MG PO TBDP: 4.0000 mg | ORAL_TABLET | Freq: Four times a day (QID) | ORAL | Status: DC | PRN

## 2021-04-12 MED ORDER — ONDANSETRON HCL 4 MG/2ML IJ SOLN
4.0000 mg | Freq: Four times a day (QID) | INTRAMUSCULAR | Status: DC | PRN
  Administered 2021-04-12 – 2021-04-13 (×2): 4 mg via INTRAVENOUS
  Filled 2021-04-12 (×2): qty 2

## 2021-04-12 MED ORDER — DIPHENHYDRAMINE HCL 50 MG/ML IJ SOLN: 25.0000 mg | Freq: Four times a day (QID) | INTRAMUSCULAR | Status: DC | PRN

## 2021-04-12 MED ORDER — ONDANSETRON HCL 4 MG/2ML IJ SOLN
4.0000 mg | Freq: Once | INTRAMUSCULAR | Status: AC
  Administered 2021-04-12: 4 mg via INTRAVENOUS
  Filled 2021-04-12: qty 2

## 2021-04-12 MED ORDER — DIPHENHYDRAMINE HCL 25 MG PO CAPS
25.0000 mg | ORAL_CAPSULE | Freq: Four times a day (QID) | ORAL | Status: DC | PRN
  Administered 2021-04-13: 25 mg via ORAL
  Filled 2021-04-12: qty 1

## 2021-04-12 MED ORDER — SODIUM CHLORIDE 0.9 % IV BOLUS
500.0000 mL | Freq: Once | INTRAVENOUS | Status: AC
  Administered 2021-04-12: 500 mL via INTRAVENOUS

## 2021-04-12 MED ORDER — GADOBUTROL 1 MMOL/ML IV SOLN
5.0000 mL | Freq: Once | INTRAVENOUS | Status: AC | PRN
  Administered 2021-04-12: 5 mL via INTRAVENOUS

## 2021-04-12 MED ORDER — FENTANYL CITRATE PF 50 MCG/ML IJ SOSY
50.0000 ug | PREFILLED_SYRINGE | INTRAMUSCULAR | Status: AC | PRN
  Administered 2021-04-12 (×2): 50 ug via INTRAVENOUS
  Filled 2021-04-12 (×2): qty 1

## 2021-04-12 NOTE — ED Notes (Signed)
Pt given 1/2 a cup of ice chips, per Jess - RN.

## 2021-04-12 NOTE — ED Provider Notes (Signed)
[facility] EMERGENCY DEPT Provider Note   CSN: [id number] Arrival date & time: 04/12/21  1027     History  Chief Complaint  Patient presents with   Abdominal Pain    Jo [name] is a 31 y.o. female presented emerged part with abdominal pain.  The patient reports that she underwent cholecystectomy, which I confirmed on her medical records, on Monday.  She says she has had consistent abdominal pain referred to her right shoulder since then.  She reports the pain has been out of control, 10 out of 10, despite Percocet.  She has been able to eat, drink, and have regular bowel movements, denies constipation or diarrhea.  She denies fevers.  She is upset because her pain is not being adequately treated.  She says her follow-up appointment with the surgeon is in 2 weeks.  HPI     Home Medications Prior to Admission medications   Medication Sig Start Date End Date Taking? Authorizing Provider  acetaminophen (TYLENOL) 500 MG tablet Take 1-2 tablets (500-1,000 mg total) by mouth every 6 (six) hours as needed for mild pain. 04/08/21   Adam Phenix, PA-C  aspirin-acetaminophen-caffeine (EXCEDRIN MIGRAINE) [phone] MG tablet Take 1 tablet by mouth every 6 (six) hours as needed for headache.    [provider]  ondansetron (ZOFRAN) 4 MG tablet Take 1 tablet (4 mg total) by mouth every 8 (eight) hours as needed for up to 12 doses for nausea or vomiting. Patient not taking: Reported on 04/08/2021 04/06/21   Virgina Norfolk, DO  oxyCODONE-acetaminophen (PERCOCET) 5-325 MG tablet Take 1 tablet by mouth every 4 (four) hours as needed for severe pain. 04/08/21 04/08/22  Manus Rudd, MD  Prenatal Vit-Fe Fumarate-FA (PRENATAL MULTIVITAMIN) TABS tablet Take 1 tablet by mouth daily at 12 noon. Patient not taking: Reported on 04/08/2021    [provider]      Allergies    Lemon flavor, Orange flavor, Amoxicillin, and Aspirin    Review of Systems   Review of  Systems  Physical Exam Updated Vital Signs BP 119/88    Pulse 72    Temp 98.6 F (37 C) (Oral)    Resp 18    LMP 03/15/2021    SpO2 97%  Physical Exam Constitutional:      General: She is not in acute distress. HENT:     Head: Normocephalic and atraumatic.  Eyes:     Conjunctiva/sclera: Conjunctivae normal.     Pupils: Pupils are equal, round, and reactive to light.  Cardiovascular:     Rate and Rhythm: Normal rate and regular rhythm.  Pulmonary:     Effort: Pulmonary effort is normal. No respiratory distress.  Abdominal:     General: There is no distension.     Tenderness: There is generalized abdominal tenderness. There is no guarding.     Comments: Surgical lap incision sites clean, intact, nonerhythematous  Skin:    General: Skin is warm and dry.  Neurological:     General: No focal deficit present.     Mental Status: She is alert. Mental status is at baseline.  Psychiatric:        Mood and Affect: Mood normal.        Behavior: Behavior normal.    ED Results / Procedures / Treatments   Labs (all labs ordered are listed, but only abnormal results are displayed) Labs Reviewed  LIPASE, BLOOD - Abnormal; Notable for the following components:      Result Value  Lipase <10 (*)    All other components within normal limits  COMPREHENSIVE METABOLIC PANEL - Abnormal; Notable for the following components:   Chloride 95 (*)    Glucose, Bld 104 (*)    ALT 113 (*)    Total Bilirubin 4.1 (*)    All other components within normal limits  URINALYSIS, ROUTINE W REFLEX MICROSCOPIC - Abnormal; Notable for the following components:   Specific Gravity, Urine >1.046 (*)    Hgb urine dipstick LARGE (*)    Bilirubin Urine SMALL (*)    Ketones, ur >80 (*)    Protein, ur 30 (*)    All other components within normal limits  RESP PANEL BY RT-PCR (FLU A&B, COVID) ARPGX2  CBC  HCG, QUANTITATIVE, PREGNANCY    EKG None  Radiology DG Chest 2 View  Result Date: 04/12/2021 CLINICAL  DATA:  Right-sided pleuritic chest pain with nausea status post recent cholecystectomy (04/08/2021). EXAM: CHEST - 2 VIEW COMPARISON:  Radiographs 03/28/2017. FINDINGS: The heart size and mediastinal contours are stable. The lungs are clear. There is no pleural effusion or pneumothorax. Pneumoperitoneum is noted below the right hemidiaphragm. There are cholecystectomy clips. The bones appear unremarkable. IMPRESSION: 1. No acute chest findings. 2. Nonspecific pneumoperitoneum abdominal surgery. See separate abdominopelvic CT report. Electronically Signed   By: Richardean Sale M.D.   On: 04/12/2021 13:07   CT ABDOMEN PELVIS W CONTRAST  Result Date: 04/12/2021 CLINICAL DATA:  Status post cholecystectomy. Worsening abdominal pain. Evaluate for abscess and or evidence of retained bowel duct stones. EXAM: CT ABDOMEN AND PELVIS WITH CONTRAST TECHNIQUE: Multidetector CT imaging of the abdomen and pelvis was performed using the standard protocol following bolus administration of intravenous contrast. RADIATION DOSE REDUCTION: This exam was performed according to the departmental dose-optimization program which includes automated exposure control, adjustment of the mA and/or kV according to patient size and/or use of iterative reconstruction technique. CONTRAST:  54mL OMNIPAQUE IOHEXOL 300 MG/ML  SOLN COMPARISON:  04/06/2021 FINDINGS: Lower chest: No acute abnormality. Hepatobiliary: No focal liver abnormality identified. Status post cholecystectomy. No significant bile duct dilatation. Nonspecific amorphous material within the gallbladder fossa is identified measuring 2.7 x 3.5 cm, image 26/2. Pancreas: Unremarkable. No pancreatic ductal dilatation or surrounding inflammatory changes. Spleen: Normal in size without focal abnormality. Adrenals/Urinary Tract: Normal adrenal glands. No nephrolithiasis, hydronephrosis or mass. Urinary bladder is unremarkable. Stomach/Bowel: The stomach appears normal. No signs of bowel wall  thickening, inflammation, or distension. Vascular/Lymphatic: Normal appearance of the abdominal aorta. The upper abdominal vasculature appears patent. No abdominopelvic adenopathy. Increased parametrial vascularity is identified bilaterally which may be seen with pelvic congestion syndrome. Reproductive:  The uterus and adnexal structures are unremarkable. Other: There is a moderate volume of free fluid identified within the abdomen and pelvis. This measures 12.7 Hounsfield units. No well-defined loculated fluid collection identified at this time to suggest a drainable abscess. Moderate pneumoperitoneum is identified, likely postoperative. No calcified gallstones identified. Musculoskeletal: No acute or significant osseous findings. IMPRESSION: 1. Status post cholecystectomy. Moderate volume of low-attenuation free fluid identified within the abdomen and pelvis. Cannot rule out underlying bile leak. Consider further evaluation with nuclear medicine hepatic biliary scan. Additionally, diagnostic paracentesis may be helpful to assess for bilious fluid. 2. Nonspecific amorphous material within the gallbladder fossa is identified measuring 2.7 x 3.5 cm. Likely postoperative change such as residual hematoma and/or Surgicel. 3. Pneumoperitoneum is likely postoperative. 4. Increased parametrial vascularity is identified bilaterally which may be seen with pelvic congestion syndrome. Electronically Signed  By: Kerby Moors M.D.   On: 04/12/2021 13:10    Procedures Procedures    Medications Ordered in ED Medications  HYDROmorphone (DILAUDID) injection 1 mg (1 mg Intravenous Given 04/12/21 1603)  fentaNYL (SUBLIMAZE) injection 50 mcg (50 mcg Intravenous Given 04/12/21 1521)  ondansetron (ZOFRAN) injection 4 mg (4 mg Intravenous Given 04/12/21 1124)  HYDROmorphone (DILAUDID) injection 1 mg (1 mg Intravenous Given 04/12/21 1148)  iohexol (OMNIPAQUE) 300 MG/ML solution 100 mL (80 mLs Intravenous Contrast Given 04/12/21 1236)   HYDROmorphone (DILAUDID) injection 1 mg (1 mg Intravenous Given 04/12/21 1358)  sodium chloride 0.9 % bolus 500 mL (0 mLs Intravenous Stopped 04/12/21 1508)  cefTRIAXone (ROCEPHIN) 2 g in sodium chloride 0.9 % 100 mL IVPB (0 g Intravenous Stopped 04/12/21 1508)  metroNIDAZOLE (FLAGYL) IVPB 500 mg (0 mg Intravenous Stopped 04/12/21 1629)    ED Course/ Medical Decision Making/ A&P Clinical Course as of 04/12/21 1643  Fri Apr 12, 2021  1354 I spoke ot Dr Georgette Dover from general surgery about the patient's postoperative findings on CT.  Difficult to exclude bile leak, but is given that she is having worsening abdominal pain, I think is reasonable to admit her at this time for further investigation.  The surgeon agrees with me and recommends admission to the surgical service for HIDA scan, which they will order upon arrival.  He has asked for a round of IV antibiotics to be given for intra-abdominal infection empiric coverage, which I have ordered IV Zosyn.  Do not see evidence of sepsis otherwise.  Patient was updated in agreement with plan for admission.  Additional pain medication ordered. [MT]  [id number] Patient does have high specific gravity and ketones in the urine and protein suggestive of some dehydration.  Normal saline bolus was ordered [MT]    Clinical Course User Index [MT] Doil Kamara, Carola Rhine, MD                           Medical Decision Making Amount and/or Complexity of Data Reviewed Labs: ordered. Radiology: ordered.  Risk Prescription drug management. Decision regarding hospitalization.   Patient is presenting with post operative abdominal pain, now post op day 4 from [facility]  Ddx includes postoperative pain with referred shoulder pain versus pneumonia versus postoperative infection versus other  I reviewed her external medical records including the operative note by Dr Georgette Dover on 04/08/21, which appears have been a fairly uncomplicated procedure at the time.  I ordered imaging including  CT of the abdomen pelvis with contrast to evaluate for postoperative infection or abscess and x-ray of the chest to evaluate for pneumonia or pneumothorax.   Per my interpretation of these images - small pneumoperitenium, explored post-operatively; no evident PNA or large PTX; no visible abscess; small fluid collection near GB fossa which may be post-operative; questionable fluid collection suggesting bile leak.  I reviewed pain medication including IV fentanyl and then IV Dilaudid; IV fluids and IV antibiotics for empiric coverage of intraabdominal infection  Supplemental history was provided by the patient's fianc at the bedside.  Labs reviewed - Tbili elevation to 4.1, with no significant transminitis; lipase wnl; no leukocytosis  Doubt sepsis, PE, at the time of admission.  Vitals stable.  Holding NPO at this time with the possibility of HIDA scan after admission          Final Clinical Impression(s) / ED Diagnoses Final diagnoses:  Abdominal pain, unspecified abdominal location    Rx /  DC Orders ED Discharge Orders     None         Wyvonnia Dusky, MD 04/12/21 [phone]

## 2021-04-12 NOTE — ED Notes (Signed)
[facility] at bedside to transport pt, [facility] called and report given.

## 2021-04-12 NOTE — ED Notes (Signed)
[facility] called for report, ETA 10 min. Pt stable for transfer °

## 2021-04-12 NOTE — Progress Notes (Signed)
Pharmacy Antibiotic Note  Jo [name] is a 31 y.o. female admitted on 04/12/2021 with  Intraabdominal infection .  Pharmacy has been consulted for zosyn dosing.  CrCl 77 ml/min History of rash with amoxicillin, penicillin  Plan: Will start zosyn 3.375 gm q8hr.  Monitor for signs/symptoms of rash  Will monitor for acute changes in renal function and adjust as needed F/u cultures results and de-escalate as appropriate     Temp (24hrs), Avg:98.4 F (36.9 C), Min:98.3 F (36.8 C), Max:98.6 F (37 C)  Recent Labs  Lab 04/06/21 1330 04/08/21 0442 04/12/21 1056  WBC 11.3* 7.0 8.8  CREATININE 0.67 0.62 0.48    Estimated Creatinine Clearance: 77.4 mL/min (by C-G formula based on SCr of 0.48 mg/dL).    Allergies  Allergen Reactions   Lemon Flavor Nausea And Vomiting   Orange Flavor Nausea And Vomiting   Amoxicillin    Aspirin Other (See Comments)    Pt states that the drug does not "break-down"  in her body.   Thank you for allowing pharmacy to be a part of this patients care.  Tomie China, PharmD Clinical Pharmacist  Please check AMION for all [facility] Pharmacy numbers After 10:00 PM, call Main Pharmacy [phone]

## 2021-04-12 NOTE — ED Triage Notes (Signed)
Patient reports to the ER after having surgery Monday. States she has been having nausea, abdominal pain, and has only had a small BM since the surgery at 0300 this morning. Sent by [facility] for evaluation of possible ileus.

## 2021-04-12 NOTE — ED Notes (Signed)
Attempt to call RN on 6N, RN Aurea Graff unable to take report, will call back in 10  min per request

## 2021-04-12 NOTE — H&P (Signed)
CC: severe abdominal pain  Requesting provider: Dr Langston Masker  HPI: Jo [name] is an 31 y.o. female who is here for evaluation because of severe right-sided pain that goes from her right shoulder down her right side since undergoing laparoscopic cholecystectomy on January 30.  She was discharged from PACU.  She states that she she has had this pain since surgery.  However it is worsened.  No fever but some subjective chills.  She has had nausea and some vomiting.  She feels like she has been staying hydrated.  She initially did not know if her symptoms were due to her period (but it would be 10 days early)  she has abdominal cramps when it occurs.  No melena hematochezia.  No hematemesis.  No dysuria.  +hematuria. No discharge.  She saw her PCP today but was directed to a medical facility for further evaluation given her severe pain.  She had ketones in her urine at her doctor's office.  At the [facility] she was found to have a bilirubin of 4.1.  She had a CT scan which demonstrated some pneumoperitoneum not uncommon after recent laparoscopic cholecystectomy.  She had a moderate amount of free fluid and amorphous material in the gallbladder fossa measuring 3 cm x 3 cm.  Has had a small BM since surgery.  Past Medical History:  Diagnosis Date   Headache    Heart murmur    bicuspid aorta valve   PONV (postoperative nausea and vomiting)     Past Surgical History:  Procedure Laterality Date   AORTIC VALVE SURGERY  1994   Stated in Prenatal Record that she had surgery as baby to repair valve, but told our MD  the valve corrected itself   CHOLECYSTECTOMY N/A 04/08/2021   Procedure: LAPAROSCOPIC CHOLECYSTECTOMY;  Surgeon: Donnie Mesa, MD;  Location: [place];  Service: General;  Laterality: N/A;   MOUTH SURGERY     TONSILLECTOMY      Family History  Problem Relation Age of Onset   Cancer Mother        cervical   Miscarriages / Stillbirths Mother        3 miscarriages   Arthritis Mother     Depression Mother    Learning disabilities Brother        ADHD   Asthma Brother    Birth defects Brother    Cancer Maternal Aunt        cervical   Mental illness Maternal Aunt    Drug abuse Maternal Aunt    Diabetes Paternal Aunt    Drug abuse Paternal Aunt    Diabetes Maternal Grandmother    Heart disease Maternal Grandmother    Hypertension Maternal Grandmother    Heart disease Maternal Grandfather    Alcohol abuse Maternal Grandfather    Alcohol abuse Father    Drug abuse Father    Mental illness Father    Heart disease Paternal Grandmother    Anesthesia problems Neg Hx    Hypotension Neg Hx    Malignant hyperthermia Neg Hx    Pseudochol deficiency Neg Hx     Social:  reports that she has never smoked. She has never been exposed to tobacco smoke. She has never used smokeless tobacco. She reports that she does not drink alcohol and does not use drugs.  Allergies:  Allergies  Allergen Reactions   Lemon Flavor Nausea And Vomiting   Orange Flavor Nausea And Vomiting   Amoxicillin    Aspirin Other (See Comments)  Pt states that the drug does not "break-down"  in her body.    Medications: I have reviewed the patient's current medications.   ROS - all of the below systems have been reviewed with the patient and positives are indicated with bold text General: chills, fever or night sweats Eyes: blurry vision or double vision ENT: epistaxis or sore throat Allergy/Immunology: itchy/watery eyes or nasal congestion Hematologic/Lymphatic: bleeding problems, blood clots or swollen lymph nodes Endocrine: temperature intolerance or unexpected weight changes Breast: new or changing breast lumps or nipple discharge Resp: cough, shortness of breath, or wheezing CV: chest pain or dyspnea on exertion GI: as per HPI GU: dysuria, trouble voiding, or hematuria MSK: joint pain or joint stiffness Neuro: TIA or stroke symptoms Derm: pruritus and skin lesion changes Psych:  anxiety and depression  PE Blood pressure (!) 124/93, pulse 76, temperature 98.4 F (36.9 C), temperature source Oral, resp. rate 16, last menstrual period 03/15/2021, SpO2 98 %, unknown if currently breastfeeding. Constitutional: NAD; conversant; no deformities, appears uncomfortable Eyes: Moist conjunctiva; no lid lag; anicteric; PERRL Neck: Trachea midline; no thyromegaly Lungs: Normal respiratory effort; no tactile fremitus CV: RRR; no palpable thrills; no pitting edema GI: Abd nondistended, soft, incisions ok. TTP on right side of abdomen ; no palpable hepatosplenomegaly MSK: Normal gait; no clubbing/cyanosis Psychiatric: Appropriate affect; alert and oriented x3; tearful at times Lymphatic: No palpable cervical or axillary lymphadenopathy Skin:no rash, lesions, jaundice.    Results for orders placed or performed during the hospital encounter of 04/12/21 (from the past 48 hour(s))  Lipase, blood     Status: Abnormal   Collection Time: 04/12/21 10:56 AM  Result Value Ref Range   Lipase <10 (L) 11 - 51 U/L    Comment: Performed at [facility], [place], [place] [address]  Comprehensive metabolic panel     Status: Abnormal   Collection Time: 04/12/21 10:56 AM  Result Value Ref Range   Sodium 138 135 - 145 mmol/L   Potassium 3.7 3.5 - 5.1 mmol/L   Chloride 95 (L) 98 - 111 mmol/L   CO2 31 22 - 32 mmol/L   Glucose, Bld 104 (H) 70 - 99 mg/dL    Comment: Glucose reference range applies only to samples taken after fasting for at least 8 hours.   BUN 16 6 - 20 mg/dL   Creatinine, Ser 0.48 0.44 - 1.00 mg/dL   Calcium 10.0 8.9 - 10.3 mg/dL   Total Protein 7.9 6.5 - 8.1 g/dL   Albumin 4.4 3.5 - 5.0 g/dL   AST 19 15 - 41 U/L   ALT 113 (H) 0 - 44 U/L   Alkaline Phosphatase 104 38 - 126 U/L   Total Bilirubin 4.1 (H) 0.3 - 1.2 mg/dL   GFR, Estimated >60 >60 mL/min    Comment: (NOTE) Calculated using the CKD-EPI Creatinine Equation (2021)    Anion  gap 12 5 - 15    Comment: Performed at [facility], [address] [place], [place] [address]  CBC     Status: None   Collection Time: 04/12/21 10:56 AM  Result Value Ref Range   WBC 8.8 4.0 - 10.5 K/uL   RBC 5.02 3.87 - 5.11 MIL/uL   Hemoglobin 14.4 12.0 - 15.0 g/dL   HCT 41.8 36.0 - 46.0 %   MCV 83.3 80.0 - 100.0 fL   MCH 28.7 26.0 - 34.0 pg   MCHC 34.4 30.0 - 36.0 g/dL   RDW 11.8 11.5 -  15.5 %   Platelets 204 150 - 400 K/uL   nRBC 0.0 0.0 - 0.2 %    Comment: Performed at [facility], [address], [place], [place] [address]  Urinalysis, Routine w reflex microscopic Urine, Clean Catch     Status: Abnormal   Collection Time: 04/12/21 10:56 AM  Result Value Ref Range   Color, Urine YELLOW YELLOW   APPearance CLEAR CLEAR   Specific Gravity, Urine >1.046 (H) 1.005 - 1.030   pH 6.5 5.0 - 8.0   Glucose, UA NEGATIVE NEGATIVE mg/dL   Hgb urine dipstick LARGE (A) NEGATIVE   Bilirubin Urine SMALL (A) NEGATIVE   Ketones, ur >80 (A) NEGATIVE mg/dL   Protein, ur 30 (A) NEGATIVE mg/dL   Nitrite NEGATIVE NEGATIVE   Leukocytes,Ua NEGATIVE NEGATIVE   RBC / HPF 21-50 0 - 5 RBC/hpf   WBC, UA 0-5 0 - 5 WBC/hpf   Squamous Epithelial / LPF 0-5 0 - 5   Mucus PRESENT     Comment: Performed at [facility], [address], [place], [place] [address]  hCG, quantitative, pregnancy     Status: None   Collection Time: 04/12/21 10:56 AM  Result Value Ref Range   hCG, Beta Chain, Quant, S <1 <5 mIU/mL    Comment:          GEST. AGE      CONC.  (mIU/mL)   <=1 WEEK        5 - 50     2 WEEKS       50 - 500     3 WEEKS       100 - 10,000     4 WEEKS     1,000 - 30,000     5 WEEKS     3,500 - 115,000   6-8 WEEKS     12,000 - 270,000    12 WEEKS     15,000 - 220,000        FEMALE AND NON-PREGNANT FEMALE:     LESS THAN 5 mIU/mL Performed at [facility], [address], [place], [place] [address]   Resp Panel by  RT-PCR (Flu A&B, Covid) Nasopharyngeal Swab     Status: None   Collection Time: 04/12/21  2:04 PM   Specimen: Nasopharyngeal Swab; Nasopharyngeal(NP) swabs in vial transport medium  Result Value Ref Range   SARS Coronavirus 2 by RT PCR NEGATIVE NEGATIVE    Comment: (NOTE) SARS-CoV-2 target nucleic acids are NOT DETECTED.  The SARS-CoV-2 RNA is generally detectable in upper respiratory specimens during the acute phase of infection. The lowest concentration of SARS-CoV-2 viral copies this assay can detect is 138 copies/mL. A negative result does not preclude SARS-Cov-2 infection and should not be used as the sole basis for treatment or other patient management decisions. A negative result may occur with  improper specimen collection/handling, submission of specimen other than nasopharyngeal swab, presence of viral mutation(s) within the areas targeted by this assay, and inadequate number of viral copies(<138 copies/mL). A negative result must be combined with clinical observations, patient history, and epidemiological information. The expected result is Negative.  Fact Sheet for Patients:  EntrepreneurPulse.com.au  Fact Sheet for Healthcare Providers:  IncredibleEmployment.be  This test is no t yet approved or cleared by the [place] FDA and  has been authorized for detection and/or diagnosis of SARS-CoV-2 by FDA under an Emergency Use Authorization (EUA). This EUA will remain  in effect (meaning this test can be  used) for the duration of the COVID-19 declaration under Section 564(b)(1) of the Act, 21 U.S.C.section 360bbb-3(b)(1), unless the authorization is terminated  or revoked sooner.       Influenza A by PCR NEGATIVE NEGATIVE   Influenza B by PCR NEGATIVE NEGATIVE    Comment: (NOTE) The Xpert Xpress SARS-CoV-2/FLU/RSV plus assay is intended as an aid in the diagnosis of influenza from Nasopharyngeal swab specimens and should not be  used as a sole basis for treatment. Nasal washings and aspirates are unacceptable for Xpert Xpress SARS-CoV-2/FLU/RSV testing.  Fact Sheet for Patients: EntrepreneurPulse.com.au  Fact Sheet for Healthcare Providers: IncredibleEmployment.be  This test is not yet approved or cleared by the [place] FDA and has been authorized for detection and/or diagnosis of SARS-CoV-2 by FDA under an Emergency Use Authorization (EUA). This EUA will remain in effect (meaning this test can be used) for the duration of the COVID-19 declaration under Section 564(b)(1) of the Act, 21 U.S.C. section 360bbb-3(b)(1), unless the authorization is terminated or revoked.  Performed at [facility], [address], [place], [place] [address]     DG Chest 2 View  Result Date: 04/12/2021 CLINICAL DATA:  Right-sided pleuritic chest pain with nausea status post recent cholecystectomy (04/08/2021). EXAM: CHEST - 2 VIEW COMPARISON:  Radiographs 03/28/2017. FINDINGS: The heart size and mediastinal contours are stable. The lungs are clear. There is no pleural effusion or pneumothorax. Pneumoperitoneum is noted below the right hemidiaphragm. There are cholecystectomy clips. The bones appear unremarkable. IMPRESSION: 1. No acute chest findings. 2. Nonspecific pneumoperitoneum abdominal surgery. See separate abdominopelvic CT report. Electronically Signed   By: Richardean Sale M.D.   On: 04/12/2021 13:07   CT ABDOMEN PELVIS W CONTRAST  Result Date: 04/12/2021 CLINICAL DATA:  Status post cholecystectomy. Worsening abdominal pain. Evaluate for abscess and or evidence of retained bowel duct stones. EXAM: CT ABDOMEN AND PELVIS WITH CONTRAST TECHNIQUE: Multidetector CT imaging of the abdomen and pelvis was performed using the standard protocol following bolus administration of intravenous contrast. RADIATION DOSE REDUCTION: This exam was performed according to the  departmental dose-optimization program which includes automated exposure control, adjustment of the mA and/or kV according to patient size and/or use of iterative reconstruction technique. CONTRAST:  74mL OMNIPAQUE IOHEXOL 300 MG/ML  SOLN COMPARISON:  04/06/2021 FINDINGS: Lower chest: No acute abnormality. Hepatobiliary: No focal liver abnormality identified. Status post cholecystectomy. No significant bile duct dilatation. Nonspecific amorphous material within the gallbladder fossa is identified measuring 2.7 x 3.5 cm, image 26/2. Pancreas: Unremarkable. No pancreatic ductal dilatation or surrounding inflammatory changes. Spleen: Normal in size without focal abnormality. Adrenals/Urinary Tract: Normal adrenal glands. No nephrolithiasis, hydronephrosis or mass. Urinary bladder is unremarkable. Stomach/Bowel: The stomach appears normal. No signs of bowel wall thickening, inflammation, or distension. Vascular/Lymphatic: Normal appearance of the abdominal aorta. The upper abdominal vasculature appears patent. No abdominopelvic adenopathy. Increased parametrial vascularity is identified bilaterally which may be seen with pelvic congestion syndrome. Reproductive:  The uterus and adnexal structures are unremarkable. Other: There is a moderate volume of free fluid identified within the abdomen and pelvis. This measures 12.7 Hounsfield units. No well-defined loculated fluid collection identified at this time to suggest a drainable abscess. Moderate pneumoperitoneum is identified, likely postoperative. No calcified gallstones identified. Musculoskeletal: No acute or significant osseous findings. IMPRESSION: 1. Status post cholecystectomy. Moderate volume of low-attenuation free fluid identified within the abdomen and pelvis. Cannot rule out underlying bile leak. Consider further evaluation with nuclear medicine hepatic biliary scan. Additionally, diagnostic paracentesis  may be helpful to assess for bilious fluid. 2.  Nonspecific amorphous material within the gallbladder fossa is identified measuring 2.7 x 3.5 cm. Likely postoperative change such as residual hematoma and/or Surgicel. 3. Pneumoperitoneum is likely postoperative. 4. Increased parametrial vascularity is identified bilaterally which may be seen with pelvic congestion syndrome. Electronically Signed   By: Kerby Moors M.D.   On: 04/12/2021 13:10    Imaging: Personally reviewed  A/P: Jo [name] is an 31 y.o. 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  Exam and labs not c/w bowel injury  High level of medical decision making -I reviewed the operative note, last H&P, ER notes, nurses notes, outside physician note from today, outside labs, labs, vitals, and personally reviewed her CT scan.  Also involved decision-making regarding hospitalization.   Leighton Ruff. Redmond Pulling, MD, FACS General, Bariatric, & Minimally Invasive Surgery [facility], [place]

## 2021-04-12 NOTE — ED Notes (Signed)
ED Provider at bedside. 

## 2021-04-13 ENCOUNTER — Other Ambulatory Visit: Payer: Self-pay

## 2021-04-13 ENCOUNTER — Encounter (HOSPITAL_COMMUNITY): Admission: EM | Disposition: A | Discharge status: Home / Self Care | Payer: Self-pay | Source: Ambulatory Visit

## 2021-04-13 ENCOUNTER — Observation Stay (HOSPITAL_COMMUNITY): Payer: Medicaid Other | Admitting: Certified Registered Nurse Anesthetist

## 2021-04-13 ENCOUNTER — Encounter (HOSPITAL_COMMUNITY): Payer: Self-pay

## 2021-04-13 DIAGNOSIS — G8918 Other acute postprocedural pain: Secondary | ICD-10-CM | POA: Diagnosis present

## 2021-04-13 DIAGNOSIS — K838 Other specified diseases of biliary tract: Secondary | ICD-10-CM | POA: Diagnosis present

## 2021-04-13 DIAGNOSIS — Z20822 Contact with and (suspected) exposure to covid-19: Secondary | ICD-10-CM | POA: Diagnosis present

## 2021-04-13 DIAGNOSIS — K9189 Other postprocedural complications and disorders of digestive system: Secondary | ICD-10-CM | POA: Diagnosis present

## 2021-04-13 DIAGNOSIS — Z888 Allergy status to other drugs, medicaments and biological substances status: Secondary | ICD-10-CM | POA: Diagnosis not present

## 2021-04-13 DIAGNOSIS — Y836 Removal of other organ (partial) (total) as the cause of abnormal reaction of the patient, or of later complication, without mention of misadventure at the time of the procedure: Secondary | ICD-10-CM | POA: Diagnosis present

## 2021-04-13 DIAGNOSIS — Z880 Allergy status to penicillin: Secondary | ICD-10-CM | POA: Diagnosis not present

## 2021-04-13 DIAGNOSIS — Z9049 Acquired absence of other specified parts of digestive tract: Secondary | ICD-10-CM | POA: Diagnosis not present

## 2021-04-13 DIAGNOSIS — K653 Choleperitonitis: Secondary | ICD-10-CM | POA: Diagnosis present

## 2021-04-13 DIAGNOSIS — Z886 Allergy status to analgesic agent status: Secondary | ICD-10-CM | POA: Diagnosis not present

## 2021-04-13 HISTORY — PX: LAPAROSCOPY: SHX197

## 2021-04-13 LAB — COMPREHENSIVE METABOLIC PANEL
ALT: 81 U/L — ABNORMAL HIGH (ref 0–44)
AST: 19 U/L (ref 15–41)
Albumin: 3.1 g/dL — ABNORMAL LOW (ref 3.5–5.0)
Alkaline Phosphatase: 101 U/L (ref 38–126)
Anion gap: 13 (ref 5–15)
BUN: 14 mg/dL (ref 6–20)
CO2: 26 mmol/L (ref 22–32)
Calcium: 8.8 mg/dL — ABNORMAL LOW (ref 8.9–10.3)
Chloride: 97 mmol/L — ABNORMAL LOW (ref 98–111)
Creatinine, Ser: 0.56 mg/dL (ref 0.44–1.00)
GFR, Estimated: >60 mL/min (ref >60)
Glucose, Bld: 113 mg/dL — ABNORMAL HIGH (ref 70–99)
Potassium: 3.8 mmol/L (ref 3.5–5.1)
Sodium: 136 mmol/L (ref 135–145)
Total Bilirubin: 2.9 mg/dL — ABNORMAL HIGH (ref 0.3–1.2)
Total Protein: 6.6 g/dL (ref 6.5–8.1)

## 2021-04-13 LAB — CBC
HCT: 39.6 % (ref 36.0–46.0)
Hemoglobin: 13.7 g/dL (ref 12.0–15.0)
MCH: 29 pg (ref 26.0–34.0)
MCHC: 34.6 g/dL (ref 30.0–36.0)
MCV: 83.9 fL (ref 80.0–100.0)
Platelets: 224 10*3/uL (ref 150–400)
RBC: 4.72 MIL/uL (ref 3.87–5.11)
RDW: 11.5 % (ref 11.5–15.5)
WBC: 12.6 10*3/uL — ABNORMAL HIGH (ref 4.0–10.5)
nRBC: 0 % (ref 0.0–0.2)

## 2021-04-13 LAB — SURGICAL PCR SCREEN
MRSA, PCR: NEGATIVE
Staphylococcus aureus: NEGATIVE

## 2021-04-13 SURGERY — LAPAROSCOPY, DIAGNOSTIC
Anesthesia: General

## 2021-04-13 MED ORDER — ACETAMINOPHEN 10 MG/ML IV SOLN: 1000.0000 mg | Freq: Once | INTRAVENOUS | Status: DC | PRN

## 2021-04-13 MED ORDER — LACTATED RINGERS IV SOLN: INTRAVENOUS | Status: DC

## 2021-04-13 MED ORDER — BUPIVACAINE HCL 0.25 % IJ SOLN
INTRAMUSCULAR | Status: DC | PRN
  Administered 2021-04-13: 30 mL

## 2021-04-13 MED ORDER — LIDOCAINE 2% (20 MG/ML) 5 ML SYRINGE
INTRAMUSCULAR | Status: DC | PRN
  Administered 2021-04-13: 40 mg via INTRAVENOUS

## 2021-04-13 MED ORDER — PHENYLEPHRINE HCL (PRESSORS) 10 MG/ML IV SOLN
INTRAVENOUS | Status: DC | PRN
Start: 2021-04-13 — End: 2021-04-13
  Administered 2021-04-13: 80 ug via INTRAVENOUS

## 2021-04-13 MED ORDER — CHLORHEXIDINE GLUCONATE 0.12 % MT SOLN: 15.0000 mL | Freq: Once | OROMUCOSAL | Status: AC

## 2021-04-13 MED ORDER — ROCURONIUM BROMIDE 10 MG/ML (PF) SYRINGE
PREFILLED_SYRINGE | INTRAVENOUS | Status: DC | PRN
Start: 2021-04-13 — End: 2021-04-13
  Administered 2021-04-13: 30 mg via INTRAVENOUS

## 2021-04-13 MED ORDER — ACETAMINOPHEN 10 MG/ML IV SOLN
INTRAVENOUS | Status: DC | PRN
  Administered 2021-04-13: 1000 mg via INTRAVENOUS

## 2021-04-13 MED ORDER — ONDANSETRON HCL 4 MG/2ML IJ SOLN
INTRAMUSCULAR | Status: DC | PRN
  Administered 2021-04-13: 4 mg via INTRAVENOUS

## 2021-04-13 MED ORDER — PROMETHAZINE HCL 25 MG/ML IJ SOLN: 6.2500 mg | INTRAMUSCULAR | Status: DC | PRN

## 2021-04-13 MED ORDER — AMISULPRIDE (ANTIEMETIC) 5 MG/2ML IV SOLN: 10.0000 mg | Freq: Once | INTRAVENOUS | Status: DC | PRN

## 2021-04-13 MED ORDER — CHLORHEXIDINE GLUCONATE 0.12 % MT SOLN
OROMUCOSAL | Status: AC
  Administered 2021-04-13: 15 mL via OROMUCOSAL
  Filled 2021-04-13: qty 15

## 2021-04-13 MED ORDER — SCOPOLAMINE 1 MG/3DAYS TD PT72
1.0000 | MEDICATED_PATCH | TRANSDERMAL | Status: DC
  Administered 2021-04-13 – 2021-04-16 (×2): 1.5 mg via TRANSDERMAL
  Filled 2021-04-13 (×2): qty 1

## 2021-04-13 MED ORDER — ACETAMINOPHEN 160 MG/5ML PO SOLN: 325.0000 mg | Freq: Once | ORAL | Status: DC | PRN

## 2021-04-13 MED ORDER — SCOPOLAMINE 1 MG/3DAYS TD PT72
MEDICATED_PATCH | TRANSDERMAL | Status: AC
  Filled 2021-04-13: qty 1

## 2021-04-13 MED ORDER — SUGAMMADEX SODIUM 200 MG/2ML IV SOLN
INTRAVENOUS | Status: DC | PRN
Start: 2021-04-13 — End: 2021-04-13
  Administered 2021-04-13: 150 mg via INTRAVENOUS

## 2021-04-13 MED ORDER — FENTANYL CITRATE (PF) 250 MCG/5ML IJ SOLN
INTRAMUSCULAR | Status: DC | PRN
Start: 2021-04-13 — End: 2021-04-13
  Administered 2021-04-13 (×5): 50 ug via INTRAVENOUS

## 2021-04-13 MED ORDER — 0.9 % SODIUM CHLORIDE (POUR BTL) OPTIME
TOPICAL | Status: DC | PRN
  Administered 2021-04-13: 1000 mL

## 2021-04-13 MED ORDER — HYDROMORPHONE HCL 1 MG/ML IJ SOLN: 1.0000 mg | INTRAMUSCULAR | Status: DC | PRN

## 2021-04-13 MED ORDER — SUCCINYLCHOLINE CHLORIDE 200 MG/10ML IV SOSY
PREFILLED_SYRINGE | INTRAVENOUS | Status: DC | PRN
  Administered 2021-04-13: 120 mg via INTRAVENOUS

## 2021-04-13 MED ORDER — ENOXAPARIN SODIUM 40 MG/0.4ML IJ SOSY
40.0000 mg | PREFILLED_SYRINGE | INTRAMUSCULAR | Status: DC
  Administered 2021-04-14 – 2021-04-16 (×3): 40 mg via SUBCUTANEOUS
  Filled 2021-04-13 (×3): qty 0.4

## 2021-04-13 MED ORDER — MEPERIDINE HCL 25 MG/ML IJ SOLN: 6.2500 mg | INTRAMUSCULAR | Status: DC | PRN

## 2021-04-13 MED ORDER — MIDAZOLAM HCL 2 MG/2ML IJ SOLN
INTRAMUSCULAR | Status: DC | PRN
Start: 2021-04-13 — End: 2021-04-13
  Administered 2021-04-13: 2 mg via INTRAVENOUS

## 2021-04-13 MED ORDER — DEXAMETHASONE SODIUM PHOSPHATE 10 MG/ML IJ SOLN
INTRAMUSCULAR | Status: DC | PRN
Start: 2021-04-13 — End: 2021-04-13
  Administered 2021-04-13: 10 mg via INTRAVENOUS

## 2021-04-13 MED ORDER — HYDROMORPHONE HCL 1 MG/ML IJ SOLN: 0.2500 mg | INTRAMUSCULAR | Status: DC | PRN

## 2021-04-13 MED ORDER — PROPOFOL 500 MG/50ML IV EMUL
INTRAVENOUS | Status: DC | PRN
  Administered 2021-04-13: 20 ug/kg/min via INTRAVENOUS

## 2021-04-13 MED ORDER — HYDROMORPHONE HCL 1 MG/ML IJ SOLN: 0.5000 mg | INTRAMUSCULAR | Status: DC | PRN

## 2021-04-13 MED ORDER — MIDAZOLAM HCL 2 MG/2ML IJ SOLN
INTRAMUSCULAR | Status: AC
  Filled 2021-04-13: qty 2

## 2021-04-13 MED ORDER — FENTANYL CITRATE (PF) 250 MCG/5ML IJ SOLN
INTRAMUSCULAR | Status: AC
  Filled 2021-04-13: qty 5

## 2021-04-13 MED ORDER — PROPOFOL 10 MG/ML IV BOLUS
INTRAVENOUS | Status: DC | PRN
Start: 2021-04-13 — End: 2021-04-13
  Administered 2021-04-13: 130 mg via INTRAVENOUS

## 2021-04-13 MED ORDER — ACETAMINOPHEN 325 MG PO TABS: 325.0000 mg | ORAL_TABLET | Freq: Once | ORAL | Status: DC | PRN

## 2021-04-13 MED ORDER — PROPOFOL 10 MG/ML IV BOLUS
INTRAVENOUS | Status: AC
  Filled 2021-04-13: qty 20

## 2021-04-13 MED ORDER — DIPHENHYDRAMINE HCL 50 MG/ML IJ SOLN
INTRAMUSCULAR | Status: DC | PRN
  Administered 2021-04-13: 12.5 mg via INTRAVENOUS

## 2021-04-13 MED ORDER — SODIUM CHLORIDE 0.9 % IR SOLN
Status: DC | PRN
  Administered 2021-04-13: 3000 mL
  Administered 2021-04-13: 1000 mL
  Administered 2021-04-13: 3000 mL
  Administered 2021-04-13: 1000 mL

## 2021-04-13 MED ORDER — SODIUM CHLORIDE 0.9 % IV SOLN: INTRAVENOUS | Status: DC

## 2021-04-13 MED ORDER — CHLORHEXIDINE GLUCONATE CLOTH 2 % EX PADS: 6.0000 | MEDICATED_PAD | Freq: Once | CUTANEOUS | Status: DC

## 2021-04-13 MED ORDER — BUPIVACAINE HCL (PF) 0.25 % IJ SOLN
INTRAMUSCULAR | Status: AC
  Filled 2021-04-13: qty 30

## 2021-04-13 SURGICAL SUPPLY — 44 items
BAG COUNTER SPONGE SURGICOUNT (BAG) ×2 IMPLANT
BIOPATCH BLUE 3/4IN DISK W/1.5 (GAUZE/BANDAGES/DRESSINGS) ×1 IMPLANT
BLADE CLIPPER SURG (BLADE) IMPLANT
CANISTER SUCT 3000ML PPV (MISCELLANEOUS) ×1 IMPLANT
CHLORAPREP W/TINT 26 (MISCELLANEOUS) ×2 IMPLANT
COVER SURGICAL LIGHT HANDLE (MISCELLANEOUS) ×2 IMPLANT
DERMABOND ADVANCED (GAUZE/BANDAGES/DRESSINGS) ×1
DERMABOND ADVANCED .7 DNX12 (GAUZE/BANDAGES/DRESSINGS) IMPLANT
DRAIN CHANNEL 19F RND (DRAIN) ×1 IMPLANT
DRSG TEGADERM 4X4.75 (GAUZE/BANDAGES/DRESSINGS) ×1 IMPLANT
ELECT PENCIL ROCKER SW 15FT (MISCELLANEOUS) ×1 IMPLANT
ELECT REM PT RETURN 9FT ADLT (ELECTROSURGICAL) ×2
ELECTRODE REM PT RTRN 9FT ADLT (ELECTROSURGICAL) ×1 IMPLANT
EVACUATOR SILICONE 100CC (DRAIN) ×1 IMPLANT
GAUZE 4X4 16PLY ~~LOC~~+RFID DBL (SPONGE) ×1 IMPLANT
GAUZE SPONGE 2X2 8PLY STRL LF (GAUZE/BANDAGES/DRESSINGS) IMPLANT
GLOVE SURG POLY MICRO LF SZ5.5 (GLOVE) ×2 IMPLANT
GLOVE SURG UNDER POLY LF SZ6 (GLOVE) ×2 IMPLANT
GOWN STRL REUS W/ TWL LRG LVL3 (GOWN DISPOSABLE) ×2 IMPLANT
GOWN STRL REUS W/TWL LRG LVL3 (GOWN DISPOSABLE) ×2
IRRIG SUCT STRYKERFLOW 2 WTIP (MISCELLANEOUS) ×2
IRRIGATION SUCT STRKRFLW 2 WTP (MISCELLANEOUS) IMPLANT
KIT BASIN OR (CUSTOM PROCEDURE TRAY) ×2 IMPLANT
KIT TURNOVER KIT B (KITS) ×2 IMPLANT
L-HOOK LAP DISP 36CM (ELECTROSURGICAL) ×2
LHOOK LAP DISP 36CM (ELECTROSURGICAL) IMPLANT
NDL INSUFFLATION 14GA 120MM (NEEDLE) ×1 IMPLANT
NEEDLE INSUFFLATION 14GA 120MM (NEEDLE) ×2 IMPLANT
NS IRRIG 1000ML POUR BTL (IV SOLUTION) ×2 IMPLANT
PAD ARMBOARD 7.5X6 YLW CONV (MISCELLANEOUS) ×4 IMPLANT
SCISSORS LAP 5X35 DISP (ENDOMECHANICALS) IMPLANT
SET IRRIG TUBING LAPAROSCOPIC (IRRIGATION / IRRIGATOR) ×1 IMPLANT
SET TUBE SMOKE EVAC HIGH FLOW (TUBING) ×2 IMPLANT
SLEEVE ENDOPATH XCEL 5M (ENDOMECHANICALS) ×4 IMPLANT
SPONGE GAUZE 2X2 STER 10/PKG (GAUZE/BANDAGES/DRESSINGS) ×1
SPONGE T-LAP 18X18 ~~LOC~~+RFID (SPONGE) ×1 IMPLANT
SUT ETHILON 2 0 FS 18 (SUTURE) ×1 IMPLANT
SUT MNCRL AB 4-0 PS2 18 (SUTURE) ×2 IMPLANT
TOWEL GREEN STERILE (TOWEL DISPOSABLE) ×2 IMPLANT
TOWEL GREEN STERILE FF (TOWEL DISPOSABLE) ×2 IMPLANT
TRAY LAPAROSCOPIC MC (CUSTOM PROCEDURE TRAY) ×2 IMPLANT
TROCAR XCEL BLUNT TIP 100MML (ENDOMECHANICALS) IMPLANT
TROCAR XCEL NON-BLD 5MMX100MML (ENDOMECHANICALS) ×3 IMPLANT
WARMER LAPAROSCOPE (MISCELLANEOUS) ×2 IMPLANT

## 2021-04-13 NOTE — Progress Notes (Signed)
Subjective: Patient reports severe abdominal pain this morning. Tearful on exam. Afebrile on exam. WBC mildly elevated at 12, Tbili down to 2.9.   Objective: Vital signs in last 24 hours: Temp:  [97.8 F (36.6 C)-98.6 F (37 C)] 97.8 F (36.6 C) (02/04 0358) Pulse Rate:  [67-97] 89 (02/04 0358) Resp:  [16-20] 16 (02/04 0358) BP: (114-134)/(84-93) 114/92 (02/04 0358) SpO2:  [92 %-100 %] 92 % (02/04 0358) Last BM Date: 04/11/21  Intake/Output from previous day: 02/03 0701 - 02/04 0700 In: 870.6 [P.O.:120; IV Piggyback:750.6] Out: -  Intake/Output this shift: No intake/output data recorded.  PE: General: resting in bed, appears uncomfortable, tearful Neuro: alert and oriented, no focal deficits Resp: normal work of breathing on room air Abdomen: soft, nondistended, diffusely tender to palpation with guarding. Extremities: warm and well-perfused   Lab Results:  Recent Labs    04/12/21 1056 04/13/21 0113  WBC 8.8 12.6*  HGB 14.4 13.7  HCT 41.8 39.6  PLT 204 224   BMET Recent Labs    04/12/21 1056 04/13/21 0113  NA 138 136  K 3.7 3.8  CL 95* 97*  CO2 31 26  GLUCOSE 104* 113*  BUN 16 14  CREATININE 0.48 0.56  CALCIUM 10.0 8.8*   PT/INR No results for input(s): LABPROT, INR in the last 72 hours. CMP     Component Value Date/Time   NA 136 04/13/2021 0113   K 3.8 04/13/2021 0113   CL 97 (L) 04/13/2021 0113   CO2 26 04/13/2021 0113   GLUCOSE 113 (H) 04/13/2021 0113   BUN 14 04/13/2021 0113   CREATININE 0.56 04/13/2021 0113   CALCIUM 8.8 (L) 04/13/2021 0113   PROT 6.6 04/13/2021 0113   ALBUMIN 3.1 (L) 04/13/2021 0113   AST 19 04/13/2021 0113   ALT 81 (H) 04/13/2021 0113   ALKPHOS 101 04/13/2021 0113   BILITOT 2.9 (H) 04/13/2021 0113   GFRNONAA >60 04/13/2021 0113   GFRAA >60 05/21/2015 2032   Lipase     Component Value Date/Time   LIPASE <10 (L) 04/12/2021 1056       Studies/Results: DG Chest 2 View  Result Date:  04/12/2021 CLINICAL DATA:  Right-sided pleuritic chest pain with nausea status post recent cholecystectomy (04/08/2021). EXAM: CHEST - 2 VIEW COMPARISON:  Radiographs 03/28/2017. FINDINGS: The heart size and mediastinal contours are stable. The lungs are clear. There is no pleural effusion or pneumothorax. Pneumoperitoneum is noted below the right hemidiaphragm. There are cholecystectomy clips. The bones appear unremarkable. IMPRESSION: 1. No acute chest findings. 2. Nonspecific pneumoperitoneum abdominal surgery. See separate abdominopelvic CT report. Electronically Signed   By: Richardean Sale M.D.   On: 04/12/2021 13:07   CT ABDOMEN PELVIS W CONTRAST  Result Date: 04/12/2021 CLINICAL DATA:  Status post cholecystectomy. Worsening abdominal pain. Evaluate for abscess and or evidence of retained bowel duct stones. EXAM: CT ABDOMEN AND PELVIS WITH CONTRAST TECHNIQUE: Multidetector CT imaging of the abdomen and pelvis was performed using the standard protocol following bolus administration of intravenous contrast. RADIATION DOSE REDUCTION: This exam was performed according to the departmental dose-optimization program which includes automated exposure control, adjustment of the mA and/or kV according to patient size and/or use of iterative reconstruction technique. CONTRAST:  54mL OMNIPAQUE IOHEXOL 300 MG/ML  SOLN COMPARISON:  04/06/2021 FINDINGS: Lower chest: No acute abnormality. Hepatobiliary: No focal liver abnormality identified. Status post cholecystectomy. No significant bile duct dilatation. Nonspecific amorphous material within the gallbladder fossa is identified measuring 2.7  x 3.5 cm, image 26/2. Pancreas: Unremarkable. No pancreatic ductal dilatation or surrounding inflammatory changes. Spleen: Normal in size without focal abnormality. Adrenals/Urinary Tract: Normal adrenal glands. No nephrolithiasis, hydronephrosis or mass. Urinary bladder is unremarkable. Stomach/Bowel: The stomach appears normal. No  signs of bowel wall thickening, inflammation, or distension. Vascular/Lymphatic: Normal appearance of the abdominal aorta. The upper abdominal vasculature appears patent. No abdominopelvic adenopathy. Increased parametrial vascularity is identified bilaterally which may be seen with pelvic congestion syndrome. Reproductive:  The uterus and adnexal structures are unremarkable. Other: There is a moderate volume of free fluid identified within the abdomen and pelvis. This measures 12.7 Hounsfield units. No well-defined loculated fluid collection identified at this time to suggest a drainable abscess. Moderate pneumoperitoneum is identified, likely postoperative. No calcified gallstones identified. Musculoskeletal: No acute or significant osseous findings. IMPRESSION: 1. Status post cholecystectomy. Moderate volume of low-attenuation free fluid identified within the abdomen and pelvis. Cannot rule out underlying bile leak. Consider further evaluation with nuclear medicine hepatic biliary scan. Additionally, diagnostic paracentesis may be helpful to assess for bilious fluid. 2. Nonspecific amorphous material within the gallbladder fossa is identified measuring 2.7 x 3.5 cm. Likely postoperative change such as residual hematoma and/or Surgicel. 3. Pneumoperitoneum is likely postoperative. 4. Increased parametrial vascularity is identified bilaterally which may be seen with pelvic congestion syndrome. Electronically Signed   By: Kerby Moors M.D.   On: 04/12/2021 13:10   MR ABDOMEN MRCP W WO CONTAST  Result Date: 04/12/2021 CLINICAL DATA:  Postop cholecystectomy. Question choledocholithiasis. EXAM: MRI ABDOMEN WITHOUT AND WITH CONTRAST (INCLUDING MRCP) TECHNIQUE: Multiplanar multisequence MR imaging of the abdomen was performed both before and after the administration of intravenous contrast. Heavily T2-weighted images of the biliary and pancreatic ducts were obtained, and three-dimensional MRCP images were rendered  by post processing. CONTRAST:  55mL GADAVIST GADOBUTROL 1 MMOL/ML IV SOLN COMPARISON:  CT abdomen earlier same day FINDINGS: Lower chest: No abnormality seen. Hepatobiliary: Recent cholecystectomy. Heterogeneous material in the gallbladder fossa consistent with postoperative hematoma, an expected finding. No intra or extrahepatic biliary ductal dilatation is seen. There is good bili scratched at there is good visualization of the biliary tree and no retained stones are identified. Patient does have free fluid throughout the peritoneal space, with some free air. The free air is not unexpected. The amount of fluid could possibly indicate bile leak. Pancreas:  Normal Spleen:  Normal Adrenals/Urinary Tract:  Normal Stomach/Bowel: Normal except for what is probably a degree of postoperative ileus. Vascular/Lymphatic:  Normal Other:  None Musculoskeletal: Normal IMPRESSION: Status post cholecystectomy. Heterogeneous material in the gallbladder fossa consistent with postoperative bleeding, not unexpected in degree or amount. Small amount of free intraperitoneal air, also not unexpected. Fairly large amount of free intraperitoneal fluid raising the possibility of bile leak. This could be confirmed or excluded with nuclear medicine hepatobiliary scan. No biliary ductal dilatation. No ductal filling defects to suggest choledocholithiasis. Electronically Signed   By: Nelson Chimes M.D.   On: 04/12/2021 22:34    Anti-infectives: Anti-infectives (From admission, onward)    Start     Dose/Rate Route Frequency Ordered Stop   04/12/21 2200  piperacillin-tazobactam (ZOSYN) IVPB 3.375 g        3.375 g 12.5 mL/hr over 240 Minutes Intravenous Every 8 hours 04/12/21 2011     04/12/21 1430  cefTRIAXone (ROCEPHIN) 2 g in sodium chloride 0.9 % 100 mL IVPB        2 g 200 mL/hr over 30 Minutes Intravenous  Once  04/12/21 1424 04/12/21 1508   04/12/21 1430  metroNIDAZOLE (FLAGYL) IVPB 500 mg        500 mg 100 mL/hr over 60 Minutes  Intravenous  Once 04/12/21 1424 04/12/21 1629        Assessment/Plan 31 yo female POD5 s/p lap cholecystectomy. Presenting with elevated bilirubin and severe abdominal pain. I reviewed her CT scan and MRCP. The common bile duct is in tact with filling defects or signs of injury. There is a large amount of free fluid in the pelvis, and some perihepatic fluid as well. Given her elevated bilirubin and severe abdominal, this is most consistent with a postoperative bile leak with bile peritonitis, likely originating from the cystic duct stump. I do not feel a HIDA will add much additional information. Patient needs source control. Will proceed with a diagnostic laparoscopy and abdominal washout with drain placement in the gallbladder fossa. I discussed this with the patient at bedside and she agrees to proceed. - NPO for procedure - Continue Zosyn - IV fluid hydration - VTE: lovenox, SCDs - Dispo: inpatient, med-surg floor     LOS: 1 day    Michaelle Birks, MD [facility] General, Hepatobiliary and Pancreatic Surgery 04/13/21 8:06 AM

## 2021-04-13 NOTE — Anesthesia Preprocedure Evaluation (Addendum)
Anesthesia Evaluation  Patient identified by MRN, date of birth, ID band Patient awake    Reviewed: Allergy & Precautions, NPO status , Patient's Chart, lab work & pertinent test results  History of Anesthesia Complications (+) PONV and history of anesthetic complications  Airway Mallampati: II  TM Distance: >3 FB Neck ROM: Full    Dental  (+) Teeth Intact, Dental Advisory Given   Pulmonary neg pulmonary ROS,    breath sounds clear to auscultation       Cardiovascular + Valvular Problems/Murmurs  Rhythm:Regular Rate:Normal     Neuro/Psych  Headaches, negative psych ROS   GI/Hepatic negative GI ROS, Neg liver ROS,   Endo/Other  negative endocrine ROS  Renal/GU negative Renal ROS     Musculoskeletal negative musculoskeletal ROS (+)   Abdominal Normal abdominal exam  (+)   Peds  Hematology negative hematology ROS (+)   Anesthesia Other Findings   Reproductive/Obstetrics                            Anesthesia Physical Anesthesia Plan  ASA: 2 and emergent  Anesthesia Plan: General   Post-op Pain Management:    Induction: Rapid sequence and Cricoid pressure planned  PONV Risk Score and Plan: 4 or greater and Ondansetron, Dexamethasone, Midazolam and Scopolamine patch - Pre-op  Airway Management Planned: Oral ETT  Additional Equipment: None  Intra-op Plan:   Post-operative Plan: Extubation in OR  Informed Consent: I have reviewed the patients History and Physical, chart, labs and discussed the procedure including the risks, benefits and alternatives for the proposed anesthesia with the patient or authorized representative who has indicated his/her understanding and acceptance.     Dental advisory given  Plan Discussed with: CRNA  Anesthesia Plan Comments:         Anesthesia Quick Evaluation

## 2021-04-13 NOTE — Progress Notes (Signed)
Pt to OR permit signed, pt has not had solid foods since Wed, 2/1 and had a sip of liquids this am at 0200. Report given to Lovett Sox, RN in PreOp holding area. Pt states she has postoperative nausea. Pt is on her period so placed mesh panties and pad.

## 2021-04-13 NOTE — Transfer of Care (Signed)
Immediate Anesthesia Transfer of Care Note  Patient: Jo [name]  Procedure(s) Performed: LAPAROSCOPY DIAGNOSTIC, Abdominal washout, drain placement  Patient Location: PACU  Anesthesia Type:General  Level of Consciousness: awake, alert  and oriented  Airway & Oxygen Therapy: Patient Spontanous Breathing  Post-op Assessment: Report given to RN and Post -op Vital signs reviewed and stable  Post vital signs: Reviewed and stable  Last Vitals:  Vitals Value Taken Time  BP 125/86 04/13/21 1122  Temp    Pulse 87 04/13/21 1122  Resp 14 04/13/21 1122  SpO2 100 % 04/13/21 1122  Vitals shown include unvalidated device data.  Last Pain:  Vitals:   04/13/21 0907  TempSrc:   PainSc: 8       Patients Stated Pain Goal: 0 (04/12/21 1950)  Complications: No notable events documented.

## 2021-04-13 NOTE — Anesthesia Postprocedure Evaluation (Signed)
Anesthesia Post Note  Patient: Jo [name]  Procedure(s) Performed: LAPAROSCOPY DIAGNOSTIC, Abdominal washout, drain placement     Patient location during evaluation: PACU Anesthesia Type: General Level of consciousness: awake and alert Pain management: pain level controlled Vital Signs Assessment: post-procedure vital signs reviewed and stable Respiratory status: spontaneous breathing, nonlabored ventilation, respiratory function stable and patient connected to nasal cannula oxygen Cardiovascular status: blood pressure returned to baseline and stable Postop Assessment: no apparent nausea or vomiting Anesthetic complications: no   No notable events documented.  Last Vitals:  Vitals:   04/13/21 1205 04/13/21 1258  BP: 116/83 122/89  Pulse: 75 87  Resp: 14 16  Temp:  36.6 C  SpO2: 95% 98%    Last Pain:  Vitals:   04/13/21 1258  TempSrc: Oral  PainSc: 4                  Shelton Silvas

## 2021-04-13 NOTE — Op Note (Signed)
Date: 04/13/21  Patient: Jo [name] MRN: [id number]  Preoperative Diagnosis: Postoperative bile leak Postoperative Diagnosis: Same  Procedure: Diagnostic laparoscopy, abdominal washout, placement of intraperitoneal drain  Surgeon: Sophronia Simas, MD  EBL: Minimal  Anesthesia: General endotracheal  Specimens: None  Indications: [name] is a 31 year old female who underwent a laparoscopic cholecystectomy 5 days ago for symptomatic cholelithiasis.  She had persistent abdominal pain postoperatively and presented to the ED last night, and was noted to have a bilirubin of 4.  CT scan showed a large volume of free fluid in the pelvis, and MRCP showed that the common bile duct was intact with no filling defects.  The patient had peritonitis on exam this morning and her clinical picture was consistent with a bile leak.  She was brought to the operating room for laparoscopic washout and drain placement.  Findings: Bile peritonitis with a large volume of bile throughout the abdomen.  Two clips are in place across the cystic duct stump.  There appeared to be bile leaking from this general area but it was difficult to pinpoint the source of the leak.  Is suspect this is most likely a cystic duct stump leak.  A 19 [place] JP drain was placed in the gallbladder fossa adjacent to the cystic duct stump.  Procedure details: Informed consent was obtained in the preoperative area prior to the procedure. The patient was brought to the operating room and placed on the table in the supine position.  General anesthesia was induced and appropriate lines and drains were placed for intraoperative monitoring. Perioperative antibiotics were administered per SCIP guidelines. The abdomen was prepped and draped in the usual sterile fashion. A pre-procedure timeout was taken verifying patient identity, surgical site and procedure to be performed.  The previous umbilical incision was opened sharply and the subcutaneous  tissue was bluntly spread.  The fascial suture was identified and cut and this allowed the previous fascial incision to be opened.  Digital palpation of the peritoneum confirmed that there were no adhesions to the abdominal wall.  A Hassan trocar was placed.  The abdomen was visually inspected and there were no signs of visceral or vascular injury, and no adhesions to the abdominal wall.  There was frank bile throughout the abdomen including in the pelvis.  The peritoneal lining appeared mildly erythematous, consistent with peritonitis. Three 5 mm ports were placed in the right upper quadrant, using the prior surgical incisions.  The free fluid was suctioned out from the right upper quadrant, left upper quadrant and the pelvis, and each of these areas was copiously irrigated with saline.  There was omentum adherent to the gallbladder fossa, and this was gently taken off using blunt dissection.  Surgical clips were identified on the cystic artery and cystic duct stumps.  There appeared to be 2 clips in place on the cystic duct stump.  On close inspection there was bile welling up in this general area, but it was difficult to identify the exact location.  The abdomen was again copiously irrigated with sterile saline.  A 19 [place] JP drain was brought onto the field and inserted into the abdomen, and brought out through the right lateral port site.  It was placed in the gallbladder fossa adjacent to the cystic duct stump, and secured to the skin with 2-0 nylon suture.  The remaining ports were removed and the abdomen was desufflated.  The umbilical port site fascia was closed with a 0 Vicryl suture.  The skin at all port  sites was closed with subcuticular 4-0 Monocryl suture.  Dermabond was applied.  The patient tolerated the procedure with no apparent complications.  All counts were correct x2 at the end of the procedure. The patient was extubated and taken to PACU in stable condition.  Sophronia Simas,  MD 04/13/21 12:37 PM

## 2021-04-13 NOTE — Progress Notes (Signed)
Patient voiced complaints of itchy and feeling nauseated at 0410. Patient given Zofran 4 mg via IV and Benadryl 25 mg  for relief, both effective.

## 2021-04-13 NOTE — Progress Notes (Signed)
Pt received back from the PACU and was assisted to the bathroom to urinate 300 cc, pt on period, pad changed.  JP intact and draining.

## 2021-04-13 NOTE — Anesthesia Procedure Notes (Signed)
Procedure Name: Intubation Date/Time: 04/13/2021 10:04 AM Performed by: Clearnce Sorrel, CRNA Pre-anesthesia Checklist: Patient identified, Emergency Drugs available, Suction available and Patient being monitored Patient Re-evaluated:Patient Re-evaluated prior to induction Oxygen Delivery Method: Circle System Utilized Preoxygenation: Pre-oxygenation with 100% oxygen Induction Type: IV induction, Rapid sequence and Cricoid Pressure applied Ventilation: Mask ventilation without difficulty Laryngoscope Size: Mac and 3 Grade View: Grade I Tube type: Oral Tube size: 7.0 mm Number of attempts: 1 Airway Equipment and Method: Stylet and Oral airway Placement Confirmation: ETT inserted through vocal cords under direct vision, positive ETCO2 and breath sounds checked- equal and bilateral Secured at: 22 cm Tube secured with: Tape Dental Injury: Teeth and Oropharynx as per pre-operative assessment

## 2021-04-13 NOTE — Progress Notes (Signed)
Patient voiced complaint of pain of a 6 on pain scale 0- 10. PRN Hydromorphone 1 mg given , will monitor for relief.

## 2021-04-14 ENCOUNTER — Encounter (HOSPITAL_COMMUNITY): Payer: Self-pay | Admitting: Surgery

## 2021-04-14 LAB — CBC
HCT: 33.6 % — ABNORMAL LOW (ref 36.0–46.0)
Hemoglobin: 11.7 g/dL — ABNORMAL LOW (ref 12.0–15.0)
MCH: 29 pg (ref 26.0–34.0)
MCHC: 34.8 g/dL (ref 30.0–36.0)
MCV: 83.4 fL (ref 80.0–100.0)
Platelets: 223 10*3/uL (ref 150–400)
RBC: 4.03 MIL/uL (ref 3.87–5.11)
RDW: 11.5 % (ref 11.5–15.5)
WBC: 9.7 10*3/uL (ref 4.0–10.5)
nRBC: 0 % (ref 0.0–0.2)

## 2021-04-14 LAB — COMPREHENSIVE METABOLIC PANEL
ALT: 63 U/L — ABNORMAL HIGH (ref 0–44)
AST: 19 U/L (ref 15–41)
Albumin: 2.5 g/dL — ABNORMAL LOW (ref 3.5–5.0)
Alkaline Phosphatase: 81 U/L (ref 38–126)
Anion gap: 8 (ref 5–15)
BUN: 6 mg/dL (ref 6–20)
CO2: 28 mmol/L (ref 22–32)
Calcium: 8.3 mg/dL — ABNORMAL LOW (ref 8.9–10.3)
Chloride: 101 mmol/L (ref 98–111)
Creatinine, Ser: 0.53 mg/dL (ref 0.44–1.00)
GFR, Estimated: >60 mL/min (ref >60)
Glucose, Bld: 172 mg/dL — ABNORMAL HIGH (ref 70–99)
Potassium: 3.7 mmol/L (ref 3.5–5.1)
Sodium: 137 mmol/L (ref 135–145)
Total Bilirubin: 1.9 mg/dL — ABNORMAL HIGH (ref 0.3–1.2)
Total Protein: 5.6 g/dL — ABNORMAL LOW (ref 6.5–8.1)

## 2021-04-14 MED ORDER — TRAMADOL HCL 50 MG PO TABS
50.0000 mg | ORAL_TABLET | Freq: Four times a day (QID) | ORAL | Status: DC | PRN
  Administered 2021-04-15 – 2021-04-16 (×3): 50 mg via ORAL
  Filled 2021-04-14 (×3): qty 1

## 2021-04-14 MED ORDER — HYDROMORPHONE HCL 1 MG/ML IJ SOLN
0.5000 mg | INTRAMUSCULAR | Status: DC | PRN
  Administered 2021-04-14: 0.5 mg via INTRAVENOUS
  Filled 2021-04-14: qty 0.5

## 2021-04-14 NOTE — Plan of Care (Signed)
?  Problem: Clinical Measurements: ?Goal: Ability to maintain clinical measurements within normal limits will improve ?Outcome: Progressing ?  ?Problem: Activity: ?Goal: Risk for activity intolerance will decrease ?Outcome: Progressing ?  ?Problem: Nutrition: ?Goal: Adequate nutrition will be maintained ?Outcome: Progressing ?  ?Problem: Elimination: ?Goal: Will not experience complications related to bowel motility ?Outcome: Progressing ?  ?Problem: Pain Managment: ?Goal: General experience of comfort will improve ?Outcome: Progressing ?  ?

## 2021-04-14 NOTE — Progress Notes (Signed)
1 Day Post-Op  Subjective: Feeling much better today. Bilirubin downtrending. Tolerating clear liquids, no nausea or vomiting.   Objective: Vital signs in last 24 hours: Temp:  [97 F (36.1 C)-98.8 F (37.1 C)] 98.4 F (36.9 C) (02/05 0746) Pulse Rate:  [54-87] 54 (02/05 0746) Resp:  [14-18] 18 (02/05 0746) BP: (97-128)/(63-91) 97/63 (02/05 0746) SpO2:  [95 %-100 %] 100 % (02/05 0746) Last BM Date: 04/11/21  Intake/Output from previous day: 02/04 0701 - 02/05 0700 In: 820 [P.O.:120; I.V.:700] Out: 915 [Urine:400; Drains:490; Blood:25] Intake/Output this shift: Total I/O In: -  Out: 15 [Drains:15]  PE: General: resting in bed, appears uncomfortable, tearful Neuro: alert and oriented, no focal deficits Resp: normal work of breathing on room air Abdomen: soft, nondistended, incisions clean and dry with no erythema or induration. RUQ JP with bile-tinged fluid. Extremities: warm and well-perfused   Lab Results:  Recent Labs    04/13/21 0113 04/14/21 0040  WBC 12.6* 9.7  HGB 13.7 11.7*  HCT 39.6 33.6*  PLT 224 223   BMET Recent Labs    04/13/21 0113 04/14/21 0040  NA 136 137  K 3.8 3.7  CL 97* 101  CO2 26 28  GLUCOSE 113* 172*  BUN 14 6  CREATININE 0.56 0.53  CALCIUM 8.8* 8.3*   PT/INR No results for input(s): LABPROT, INR in the last 72 hours. CMP     Component Value Date/Time   NA 137 04/14/2021 0040   K 3.7 04/14/2021 0040   CL 101 04/14/2021 0040   CO2 28 04/14/2021 0040   GLUCOSE 172 (H) 04/14/2021 0040   BUN 6 04/14/2021 0040   CREATININE 0.53 04/14/2021 0040   CALCIUM 8.3 (L) 04/14/2021 0040   PROT 5.6 (L) 04/14/2021 0040   ALBUMIN 2.5 (L) 04/14/2021 0040   AST 19 04/14/2021 0040   ALT 63 (H) 04/14/2021 0040   ALKPHOS 81 04/14/2021 0040   BILITOT 1.9 (H) 04/14/2021 0040   GFRNONAA >60 04/14/2021 0040   GFRAA >60 05/21/2015 2032   Lipase     Component Value Date/Time   LIPASE <10 (L) 04/12/2021 1056        Studies/Results: DG Chest 2 View  Result Date: 04/12/2021 CLINICAL DATA:  Right-sided pleuritic chest pain with nausea status post recent cholecystectomy (04/08/2021). EXAM: CHEST - 2 VIEW COMPARISON:  Radiographs 03/28/2017. FINDINGS: The heart size and mediastinal contours are stable. The lungs are clear. There is no pleural effusion or pneumothorax. Pneumoperitoneum is noted below the right hemidiaphragm. There are cholecystectomy clips. The bones appear unremarkable. IMPRESSION: 1. No acute chest findings. 2. Nonspecific pneumoperitoneum abdominal surgery. See separate abdominopelvic CT report. Electronically Signed   By: Carey Bullocks M.D.   On: 04/12/2021 13:07   CT ABDOMEN PELVIS W CONTRAST  Result Date: 04/12/2021 CLINICAL DATA:  Status post cholecystectomy. Worsening abdominal pain. Evaluate for abscess and or evidence of retained bowel duct stones. EXAM: CT ABDOMEN AND PELVIS WITH CONTRAST TECHNIQUE: Multidetector CT imaging of the abdomen and pelvis was performed using the standard protocol following bolus administration of intravenous contrast. RADIATION DOSE REDUCTION: This exam was performed according to the departmental dose-optimization program which includes automated exposure control, adjustment of the mA and/or kV according to patient size and/or use of iterative reconstruction technique. CONTRAST:  65mL OMNIPAQUE IOHEXOL 300 MG/ML  SOLN COMPARISON:  04/06/2021 FINDINGS: Lower chest: No acute abnormality. Hepatobiliary: No focal liver abnormality identified. Status post cholecystectomy. No significant bile duct dilatation. Nonspecific amorphous material within the gallbladder  fossa is identified measuring 2.7 x 3.5 cm, image 26/2. Pancreas: Unremarkable. No pancreatic ductal dilatation or surrounding inflammatory changes. Spleen: Normal in size without focal abnormality. Adrenals/Urinary Tract: Normal adrenal glands. No nephrolithiasis, hydronephrosis or mass. Urinary bladder is  unremarkable. Stomach/Bowel: The stomach appears normal. No signs of bowel wall thickening, inflammation, or distension. Vascular/Lymphatic: Normal appearance of the abdominal aorta. The upper abdominal vasculature appears patent. No abdominopelvic adenopathy. Increased parametrial vascularity is identified bilaterally which may be seen with pelvic congestion syndrome. Reproductive:  The uterus and adnexal structures are unremarkable. Other: There is a moderate volume of free fluid identified within the abdomen and pelvis. This measures 12.7 Hounsfield units. No well-defined loculated fluid collection identified at this time to suggest a drainable abscess. Moderate pneumoperitoneum is identified, likely postoperative. No calcified gallstones identified. Musculoskeletal: No acute or significant osseous findings. IMPRESSION: 1. Status post cholecystectomy. Moderate volume of low-attenuation free fluid identified within the abdomen and pelvis. Cannot rule out underlying bile leak. Consider further evaluation with nuclear medicine hepatic biliary scan. Additionally, diagnostic paracentesis may be helpful to assess for bilious fluid. 2. Nonspecific amorphous material within the gallbladder fossa is identified measuring 2.7 x 3.5 cm. Likely postoperative change such as residual hematoma and/or Surgicel. 3. Pneumoperitoneum is likely postoperative. 4. Increased parametrial vascularity is identified bilaterally which may be seen with pelvic congestion syndrome. Electronically Signed   By: Kerby Moors M.D.   On: 04/12/2021 13:10   MR ABDOMEN MRCP W WO CONTAST  Result Date: 04/12/2021 CLINICAL DATA:  Postop cholecystectomy. Question choledocholithiasis. EXAM: MRI ABDOMEN WITHOUT AND WITH CONTRAST (INCLUDING MRCP) TECHNIQUE: Multiplanar multisequence MR imaging of the abdomen was performed both before and after the administration of intravenous contrast. Heavily T2-weighted images of the biliary and pancreatic ducts were  obtained, and three-dimensional MRCP images were rendered by post processing. CONTRAST:  51mL GADAVIST GADOBUTROL 1 MMOL/ML IV SOLN COMPARISON:  CT abdomen earlier same day FINDINGS: Lower chest: No abnormality seen. Hepatobiliary: Recent cholecystectomy. Heterogeneous material in the gallbladder fossa consistent with postoperative hematoma, an expected finding. No intra or extrahepatic biliary ductal dilatation is seen. There is good bili scratched at there is good visualization of the biliary tree and no retained stones are identified. Patient does have free fluid throughout the peritoneal space, with some free air. The free air is not unexpected. The amount of fluid could possibly indicate bile leak. Pancreas:  Normal Spleen:  Normal Adrenals/Urinary Tract:  Normal Stomach/Bowel: Normal except for what is probably a degree of postoperative ileus. Vascular/Lymphatic:  Normal Other:  None Musculoskeletal: Normal IMPRESSION: Status post cholecystectomy. Heterogeneous material in the gallbladder fossa consistent with postoperative bleeding, not unexpected in degree or amount. Small amount of free intraperitoneal air, also not unexpected. Fairly large amount of free intraperitoneal fluid raising the possibility of bile leak. This could be confirmed or excluded with nuclear medicine hepatobiliary scan. No biliary ductal dilatation. No ductal filling defects to suggest choledocholithiasis. Electronically Signed   By: Nelson Chimes M.D.   On: 04/12/2021 22:34    Anti-infectives: Anti-infectives (From admission, onward)    Start     Dose/Rate Route Frequency Ordered Stop   04/12/21 2200  piperacillin-tazobactam (ZOSYN) IVPB 3.375 g  Status:  Discontinued        3.375 g 12.5 mL/hr over 240 Minutes Intravenous Every 8 hours 04/12/21 2011 04/14/21 0742   04/12/21 1430  cefTRIAXone (ROCEPHIN) 2 g in sodium chloride 0.9 % 100 mL IVPB        2  g 200 mL/hr over 30 Minutes Intravenous  Once 04/12/21 1424 04/12/21 1508    04/12/21 1430  metroNIDAZOLE (FLAGYL) IVPB 500 mg        500 mg 100 mL/hr over 60 Minutes Intravenous  Once 04/12/21 1424 04/12/21 1629        Assessment/Plan 31 yo female POD6 s/p lap cholecystectomy, readmitted with a postoperative bile leak. POD s/p laparoscopic washout and drain placement. - Total drain output 490, however much of this was intraop irrigation. Continue to monitor output. If it remains high and bilious, will need to consult GI for ERCP. - Advance to regular diet, SLIV - Discontinue antibiotics - VTE: lovenox, SCDs - Dispo: inpatient, med-surg floor     LOS: 2 days    Michaelle Birks, MD [facility] General, Hepatobiliary and Pancreatic Surgery 04/14/21 9:53 AM

## 2021-04-15 NOTE — TOC Progression Note (Signed)
Transition of Care [facility]) - Progression Note    Patient Details  Name: Tamiya Cisnero MRN: [id number] Date of Birth: 09/30/1990  Transition of Care [facility]) CM/SW Contact  Jacalyn Lefevre Edson Snowball, RN Phone Number: 04/15/2021, 11:58 AM  Clinical Narrative:       Transition of Care [facility]) Screening Note   Patient Details  Name: Lilikoi Gabelman Date of Birth: 12-Sep-1990   Possible discharge to home tomorrow with drain. Bedside nurse will teach patient drain care prior to discharge.   Transition of Care Department [facility]) has reviewed patient and no TOC needs have been identified at this time. We will continue to monitor patient advancement through interdisciplinary progression rounds. If new patient transition needs arise, please place a TOC consult.        Expected Discharge Plan and Services                                                 Social Determinants of Health (SDOH) Interventions    Readmission Risk Interventions No flowsheet data found.

## 2021-04-15 NOTE — Progress Notes (Signed)
Patient ID: Jo [name], female   DOB: [birth year], 31 y.o.   MRN: [id number] [facility] Surgery Progress Note  2 Days Post-Op  Subjective: CC-  Feeling much better since surgery. Abdomen a little sore but pain improved. Denies n/v. Tolerating diet.  Objective: Vital signs in last 24 hours: Temp:  [97.8 F (36.6 C)-99.8 F (37.7 C)] 98.4 F (36.9 C) (02/06 0740) Pulse Rate:  [60-66] 64 (02/06 0839) Resp:  [16-18] 17 (02/06 0740) BP: (87-105)/(59-73) 104/73 (02/06 0839) SpO2:  [98 %-100 %] 100 % (02/06 0740) Weight:  [52.2 kg] 52.2 kg (02/05 2218) Last BM Date: 04/14/21  Intake/Output from previous day: 02/05 0701 - 02/06 0700 In: 950 [P.O.:950] Out: 95 [Drains:95] Intake/Output this shift: No intake/output data recorded.  PE: Gen:  Alert, NAD, pleasant Pulm: rate and effort normal Abd: soft, ND, NT, lap incisions cdi, drain with golden bilious tinged fluid in bulb  Lab Results:  Recent Labs    04/13/21 0113 04/14/21 0040  WBC 12.6* 9.7  HGB 13.7 11.7*  HCT 39.6 33.6*  PLT 224 223   BMET Recent Labs    04/13/21 0113 04/14/21 0040  NA 136 137  K 3.8 3.7  CL 97* 101  CO2 26 28  GLUCOSE 113* 172*  BUN 14 6  CREATININE 0.56 0.53  CALCIUM 8.8* 8.3*   PT/INR No results for input(s): LABPROT, INR in the last 72 hours. CMP     Component Value Date/Time   NA 137 04/14/2021 0040   K 3.7 04/14/2021 0040   CL 101 04/14/2021 0040   CO2 28 04/14/2021 0040   GLUCOSE 172 (H) 04/14/2021 0040   BUN 6 04/14/2021 0040   CREATININE 0.53 04/14/2021 0040   CALCIUM 8.3 (L) 04/14/2021 0040   PROT 5.6 (L) 04/14/2021 0040   ALBUMIN 2.5 (L) 04/14/2021 0040   AST 19 04/14/2021 0040   ALT 63 (H) 04/14/2021 0040   ALKPHOS 81 04/14/2021 0040   BILITOT 1.9 (H) 04/14/2021 0040   GFRNONAA >60 04/14/2021 0040   GFRAA >60 05/21/2015 2032   Lipase     Component Value Date/Time   LIPASE <10 (L) 04/12/2021 1056       Studies/Results: No results  found.  Anti-infectives: Anti-infectives (From admission, onward)    Start     Dose/Rate Route Frequency Ordered Stop   04/12/21 2200  piperacillin-tazobactam (ZOSYN) IVPB 3.375 g  Status:  Discontinued        3.375 g 12.5 mL/hr over 240 Minutes Intravenous Every 8 hours 04/12/21 2011 04/14/21 0742   04/12/21 1430  cefTRIAXone (ROCEPHIN) 2 g in sodium chloride 0.9 % 100 mL IVPB        2 g 200 mL/hr over 30 Minutes Intravenous  Once 04/12/21 1424 04/12/21 1508   04/12/21 1430  metroNIDAZOLE (FLAGYL) IVPB 500 mg        500 mg 100 mL/hr over 60 Minutes Intravenous  Once 04/12/21 1424 04/12/21 1629        Assessment/Plan 31 yo female POD#7 s/p lap cholecystectomy 1/30, readmitted with a postoperative bile leak. POD#6 s/p laparoscopic washout and drain placement 2/4 - Clinically improving, LFTs trending down - JP drain output decreasing (95cc/24 hours) - golden bile tinged - Continue to monitor drain output - if output continues to decrease and LFTs improve she may be able to go home tomorrow with the drain. If output high and bilious may need GI for ERCP.   ID - rocephin/flagyl 2/3, zosyn 2/3>>2/5 FEN - reg diet  VTE - lovenox Foley - none    LOS: 3 days    Franne Forts, [facility] 04/15/2021, 10:12 AM Please see Amion for pager number during day hours 7:00am-4:30pm

## 2021-04-15 NOTE — Plan of Care (Signed)
°  Problem: Education: Goal: Knowledge of General Education information will improve Description: Including pain rating scale, medication(s)/side effects and non-pharmacologic comfort measures Outcome: Progressing   Problem: Health Behavior/Discharge Planning: Goal: Ability to manage health-related needs will improve Outcome: Progressing   Problem: Activity: Goal: Risk for activity intolerance will decrease Outcome: Progressing   Problem: Nutrition: Goal: Adequate nutrition will be maintained Outcome: Progressing   Problem: Coping: Goal: Level of anxiety will decrease Outcome: Progressing   Problem: Elimination: Goal: Will not experience complications related to bowel motility Outcome: Progressing   Problem: Elimination: Goal: Will not experience complications related to urinary retention Outcome: Progressing   Problem: Pain Managment: Goal: General experience of comfort will improve Outcome: Progressing

## 2021-04-16 LAB — COMPREHENSIVE METABOLIC PANEL
ALT: 40 U/L (ref 0–44)
AST: 15 U/L (ref 15–41)
Albumin: 2.8 g/dL — ABNORMAL LOW (ref 3.5–5.0)
Alkaline Phosphatase: 72 U/L (ref 38–126)
Anion gap: 8 (ref 5–15)
BUN: 6 mg/dL (ref 6–20)
CO2: 30 mmol/L (ref 22–32)
Calcium: 9.2 mg/dL (ref 8.9–10.3)
Chloride: 102 mmol/L (ref 98–111)
Creatinine, Ser: 0.47 mg/dL (ref 0.44–1.00)
GFR, Estimated: >60 mL/min (ref >60)
Glucose, Bld: 96 mg/dL (ref 70–99)
Potassium: 4.5 mmol/L (ref 3.5–5.1)
Sodium: 140 mmol/L (ref 135–145)
Total Bilirubin: 1.1 mg/dL (ref 0.3–1.2)
Total Protein: 5.8 g/dL — ABNORMAL LOW (ref 6.5–8.1)

## 2021-04-16 LAB — CBC
HCT: 34 % — ABNORMAL LOW (ref 36.0–46.0)
Hemoglobin: 11.1 g/dL — ABNORMAL LOW (ref 12.0–15.0)
MCH: 28 pg (ref 26.0–34.0)
MCHC: 32.6 g/dL (ref 30.0–36.0)
MCV: 85.9 fL (ref 80.0–100.0)
Platelets: 261 10*3/uL (ref 150–400)
RBC: 3.96 MIL/uL (ref 3.87–5.11)
RDW: 11.5 % (ref 11.5–15.5)
WBC: 6.3 10*3/uL (ref 4.0–10.5)
nRBC: 0 % (ref 0.0–0.2)

## 2021-04-16 MED ORDER — TRAMADOL HCL 50 MG PO TABS: 50.0000 mg | ORAL_TABLET | Freq: Four times a day (QID) | ORAL | 0 refills | Status: AC | PRN

## 2021-04-16 NOTE — Discharge Summary (Addendum)
[facility] Discharge Summary   Patient ID: Jo [name] MRN: [id number] DOB/AGE: 06/21/1990 31 y.o.  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  Procedures Dr. Zenia Resides (04/13/2021) - Diagnostic laparoscopy, abdominal washout, placement of intraperitoneal drain   Hospital Course:  Jo [name] is a 31yo female who underwent laparoscopic cholecystectomy on 04/08/21 for cholecystitis. She returned to the ED 04/12/21 with severe right sided abdominal pain. ED work up revealed a bilirubin of 4.1 and a CT scan which demonstrated some pneumoperitoneum not uncommon after recent laparoscopic cholecystectomy as well as a moderate amount of free fluid and amorphous material in the gallbladder fossa measuring 3 cm x 3 cm. Patient was admitted to the surgical service with concern for a bile leak with bile peritonitis. She was taken to the OR on 04/13/21 for Diagnostic laparoscopy, abdominal washout, placement of intraperitoneal drain. Intraoperatively she was found to have bile peritonitis with a large volume of bile throughout the abdomen, two clips in place across the cystic duct stump with bile leaking from this general area but it was difficult to pinpoint the source of the leak; suspect this is most likely a cystic duct stump leak.  A JP drain was placed in the gallbladder fossa adjacent to the cystic duct stump. Patient tolerated procedure well and was transferred to the floor.  Diet was advanced as tolerated.  LFTs were trended and normalized. JP drain output was monitored - it appeared golden bile tinged but decreased in volume. On 04/16/21 the patient was felt stable for  discharge home with the JP drain.  Patient will follow up as below and knows to call with questions or concerns.    I have personally reviewed the patients medication history on the [place] controlled substance database.    Physical Exam: General:  Alert, NAD, pleasant, comfortable Pulm: rate and effort normal Abd:  Soft, ND, nontender, lap incisions cdi, drain with golden bilious tinged fluid in bulb (46cc/24 hours)    Allergies as of 04/16/2021       Reactions   Lemon Flavor Nausea And Vomiting   Orange Flavor Nausea And Vomiting   Amoxicillin    Patient tolerated piperacillin/tazobactam (Zosyn) in 04/2021   Aspirin Other (See Comments)   Pt states that the drug does not "break-down"  in her body.        Medication List     STOP taking these medications    oxyCODONE-acetaminophen 5-325 MG tablet Commonly known as: Percocet       TAKE these medications    acetaminophen 500 MG tablet Commonly known as: TYLENOL Take 1-2 tablets (500-1,000 mg total) by mouth every 6 (six) hours as needed for mild pain.   ondansetron 4 MG tablet Commonly known as: ZOFRAN Take 1 tablet (4 mg total) by mouth every 8 (eight) hours as needed for up to 12 doses for nausea or vomiting.   polyethylene glycol 17 g packet Commonly known as: MIRALAX / GLYCOLAX Take 17 g by mouth daily as needed for mild constipation.   senna-docusate 8.6-50 MG tablet Commonly known as: Senokot-S Take 1 tablet by mouth at bedtime as needed for mild constipation.   traMADol 50 MG tablet Commonly known as: ULTRAM Take 1 tablet (50 mg total) by mouth every 6 (  six) hours as needed for severe pain.          Follow-up Information     Donnie Mesa, MD. Go on 04/22/2021.   Specialty: General Surgery Why: Your appointment is 2/13 at 10:50am Please arrive 30 minutes prior to your appointment to check in and fill out paperwork. Bring photo ID and insurance information. Contact information: [address]  [place] [address] [phone]                  Signed: Wellington Hampshire, [facility] 04/16/2021, 10:36 AM Please see Amion for pager number during day hours 7:00am-4:30pm

## 2021-04-16 NOTE — Plan of Care (Signed)

## 2021-04-20 ENCOUNTER — Emergency Department (HOSPITAL_COMMUNITY)
Admission: EM | Admit: 2021-04-20 | Discharge: 2021-04-20 | Disposition: A | Discharge status: Home / Self Care | Payer: Medicaid Other | Attending: Emergency Medicine | Admitting: Emergency Medicine

## 2021-04-20 ENCOUNTER — Emergency Department (HOSPITAL_COMMUNITY): Payer: Medicaid Other

## 2021-04-20 ENCOUNTER — Other Ambulatory Visit: Payer: Self-pay

## 2021-04-20 ENCOUNTER — Encounter (HOSPITAL_COMMUNITY): Payer: Self-pay | Admitting: Emergency Medicine

## 2021-04-20 DIAGNOSIS — R1084 Generalized abdominal pain: Secondary | ICD-10-CM | POA: Insufficient documentation

## 2021-04-20 DIAGNOSIS — N9489 Other specified conditions associated with female genital organs and menstrual cycle: Secondary | ICD-10-CM | POA: Diagnosis not present

## 2021-04-20 LAB — COMPREHENSIVE METABOLIC PANEL
ALT: 41 U/L (ref 0–44)
AST: 17 U/L (ref 15–41)
Albumin: 3.4 g/dL — ABNORMAL LOW (ref 3.5–5.0)
Alkaline Phosphatase: 70 U/L (ref 38–126)
Anion gap: 9 (ref 5–15)
BUN: 9 mg/dL (ref 6–20)
CO2: 28 mmol/L (ref 22–32)
Calcium: 9.2 mg/dL (ref 8.9–10.3)
Chloride: 100 mmol/L (ref 98–111)
Creatinine, Ser: 0.57 mg/dL (ref 0.44–1.00)
GFR, Estimated: >60 mL/min (ref >60)
Glucose, Bld: 99 mg/dL (ref 70–99)
Potassium: 3.7 mmol/L (ref 3.5–5.1)
Sodium: 137 mmol/L (ref 135–145)
Total Bilirubin: 1 mg/dL (ref 0.3–1.2)
Total Protein: 6.6 g/dL (ref 6.5–8.1)

## 2021-04-20 LAB — I-STAT BETA HCG BLOOD, ED (MC, WL, AP ONLY): I-stat hCG, quantitative: <5 m[IU]/mL (ref <5)

## 2021-04-20 LAB — CBC
HCT: 39.5 % (ref 36.0–46.0)
Hemoglobin: 12.7 g/dL (ref 12.0–15.0)
MCH: 28.2 pg (ref 26.0–34.0)
MCHC: 32.2 g/dL (ref 30.0–36.0)
MCV: 87.6 fL (ref 80.0–100.0)
Platelets: 427 10*3/uL — ABNORMAL HIGH (ref 150–400)
RBC: 4.51 MIL/uL (ref 3.87–5.11)
RDW: 11.6 % (ref 11.5–15.5)
WBC: 6.3 10*3/uL (ref 4.0–10.5)
nRBC: 0 % (ref 0.0–0.2)

## 2021-04-20 LAB — LIPASE, BLOOD: Lipase: 29 U/L (ref 11–51)

## 2021-04-20 MED ORDER — OXYCODONE-ACETAMINOPHEN 5-325 MG PO TABS: 1.0000 | ORAL_TABLET | Freq: Four times a day (QID) | ORAL | 0 refills | Status: AC | PRN

## 2021-04-20 MED ORDER — OXYCODONE-ACETAMINOPHEN 5-325 MG PO TABS
1.0000 | ORAL_TABLET | Freq: Once | ORAL | Status: AC
  Administered 2021-04-20: 1 via ORAL
  Filled 2021-04-20: qty 1

## 2021-04-20 MED ORDER — IOHEXOL 300 MG/ML  SOLN
75.0000 mL | Freq: Once | INTRAMUSCULAR | Status: AC | PRN
  Administered 2021-04-20: 75 mL via INTRAVENOUS

## 2021-04-20 MED ORDER — MORPHINE SULFATE (PF) 4 MG/ML IV SOLN: 4.0000 mg | Freq: Once | INTRAVENOUS | Status: DC

## 2021-04-20 NOTE — ED Notes (Signed)
Refusing morphine at this time.

## 2021-04-20 NOTE — Discharge Instructions (Addendum)
Your workup was overall reassuring in the ED today and your CT scan did not show any new findings. It did show improvement of your previous issues.   Please keep appointment with Dr. [name] as scheduled for Monday  Pick up pain medication and take as needed. Do not take this with additional Tylenol. Do not take this as well as Tramadol.   Return to the ED for any new/worsening symptoms

## 2021-04-20 NOTE — ED Notes (Signed)
ED Provider at bedside. 

## 2021-04-20 NOTE — ED Provider Notes (Signed)
[facility] EMERGENCY DEPARTMENT Provider Note   CSN: [id number] Arrival date & time: 04/20/21  0341     History  Chief Complaint  Patient presents with   Abdominal Pain    Jo [name] is a 31 y.o. female who presents to the ED today with complaint of gradual onset, constant, worsening, diffuse, abdominal pain that began yesterday. Pt reports she was laying in bed when her pitbull dog walked over her stomach. She states that since that time she has had worsening abdominal pain and increased output from her JP drain. Per chart review: Pt underwent laparoscopic cholecystectomy on 04/08/21 and returned to the ED on 02/03 for severe right sided pain. CT scan with findings of pneumoperitoneum and moderate amount of free fluid/amorphous material in the gallbladder concerning for bile leak. Pt went to the OR on 04/13/21 for diagnostic laparoscopy, abdominal washout, placement of intraperitoneal drain.  Pt discharged on 02/07 and states she had been doing well until yesterday. She has been taking Tramadol at home without relief of her pain. Last took Tramadol dose at 1 AM this morning. She denies any nausea, vomiting, diarrhea, constipation. Last normal bowel movement yesterday. Does note she feels "hot and cold."   The history is provided by the patient and medical records.      Home Medications Prior to Admission medications   Medication Sig Start Date End Date Taking? Authorizing Provider  acetaminophen (TYLENOL) 500 MG tablet Take 1-2 tablets (500-1,000 mg total) by mouth every 6 (six) hours as needed for mild pain. 04/08/21   Adam Phenix, PA-C  ondansetron (ZOFRAN) 4 MG tablet Take 1 tablet (4 mg total) by mouth every 8 (eight) hours as needed for up to 12 doses for nausea or vomiting. 04/06/21   Curatolo, Adam, DO  polyethylene glycol (MIRALAX / GLYCOLAX) 17 g packet Take 17 g by mouth daily as needed for mild constipation.    [provider]   senna-docusate (SENOKOT-S) 8.6-50 MG tablet Take 1 tablet by mouth at bedtime as needed for mild constipation.    [provider]  traMADol (ULTRAM) 50 MG tablet Take 1 tablet (50 mg total) by mouth every 6 (six) hours as needed for severe pain. 04/16/21   Meuth, Lina Sar, PA-C      Allergies    Lemon flavor, Orange flavor, Amoxicillin, and Aspirin    Review of Systems   Review of Systems  Constitutional:  Positive for chills and fever (subjective).  Gastrointestinal:  Positive for abdominal pain. Negative for constipation, diarrhea, nausea and vomiting.  Genitourinary:  Negative for dysuria, flank pain and frequency.  All other systems reviewed and are negative.  Physical Exam Updated Vital Signs BP 100/68 (BP Location: Right Arm)    Pulse 67    Temp 98.1 F (36.7 C) (Oral)    Resp 18    SpO2 100%  Physical Exam Vitals and nursing note reviewed.  Constitutional:      Appearance: She is not ill-appearing or diaphoretic.  HENT:     Head: Normocephalic and atraumatic.  Eyes:     Conjunctiva/sclera: Conjunctivae normal.  Cardiovascular:     Rate and Rhythm: Normal rate and regular rhythm.     Heart sounds: Normal heart sounds.  Pulmonary:     Effort: Pulmonary effort is normal.     Breath sounds: Normal breath sounds. No wheezing, rhonchi or rales.  Abdominal:     General: Abdomen is flat.     Palpations: Abdomen is soft.  Tenderness: There is generalized abdominal tenderness. There is no guarding or rebound.     Comments: JP drain with light honey colored output  Musculoskeletal:     Cervical back: Neck supple.  Skin:    General: Skin is warm and dry.  Neurological:     Mental Status: She is alert.    ED Results / Procedures / Treatments   Labs (all labs ordered are listed, but only abnormal results are displayed) Labs Reviewed  COMPREHENSIVE METABOLIC PANEL - Abnormal; Notable for the following components:      Result Value   Albumin 3.4 (*)    All other  components within normal limits  CBC - Abnormal; Notable for the following components:   Platelets 427 (*)    All other components within normal limits  LIPASE, BLOOD  URINALYSIS, ROUTINE W REFLEX MICROSCOPIC  I-STAT BETA HCG BLOOD, ED ([facility], [facility], [facility] ONLY)    EKG None  Radiology CT Abdomen Pelvis W Contrast  Result Date: 04/20/2021 CLINICAL DATA:  31 year old female with history of recent cholecystectomy complicated by bile leak. Worsening abdominal pain and increased drain output. EXAM: CT ABDOMEN AND PELVIS WITH CONTRAST TECHNIQUE: Multidetector CT imaging of the abdomen and pelvis was performed using the standard protocol following bolus administration of intravenous contrast. RADIATION DOSE REDUCTION: This exam was performed according to the departmental dose-optimization program which includes automated exposure control, adjustment of the mA and/or kV according to patient size and/or use of iterative reconstruction technique. CONTRAST:  68mL OMNIPAQUE IOHEXOL 300 MG/ML  SOLN COMPARISON:  CT the abdomen and pelvis 04/12/2021. FINDINGS: Lower chest: Unremarkable. Hepatobiliary: No suspicious cystic or solid hepatic lesions. No intra or extrahepatic biliary ductal dilatation. Status post cholecystectomy. Previously noted postoperative fluid collection in the gallbladder fossa has nearly completely resolved. Pancreas: No pancreatic mass. No pancreatic ductal dilatation. No pancreatic or peripancreatic fluid collections or inflammatory changes. Spleen: Unremarkable. Adrenals/Urinary Tract: Bilateral kidneys and adrenal glands are normal in appearance. No hydroureteronephrosis. Urinary bladder is normal in appearance. Stomach/Bowel: The appearance of the stomach is normal. There is no pathologic dilatation of small bowel or colon. Appendix is not confidently identified may be surgically absent. Vascular/Lymphatic: No significant atherosclerotic disease, aneurysm or dissection noted in the abdominal or  pelvic vasculature. No lymphadenopathy noted in the abdomen or pelvis. Reproductive: Uterus and ovaries are unremarkable in appearance. Other: Surgical drain in position in the right upper quadrant of the abdomen. Trace amount of fluid adjacent to the tip of the drainage catheter, decreased substantially compared to the prior examination. No residual pneumoperitoneum. Musculoskeletal: Soft tissue stranding and enhancement in the periumbilical region, similar to the prior examination, presumably healing at the site of recent laparoscopy port placement. There are no aggressive appearing lytic or blastic lesions noted in the visualized portions of the skeleton. IMPRESSION: 1. No acute abnormalities on today's examination. 2. Surgical drain in position with near complete resolution of previously noted intra-abdominal fluid. 3. Tiny postoperative fluid collection in the gallbladder fossa nearly completely resolved compared to the prior examination. 4. Additional incidental findings, as above. Electronically Signed   By: Trudie Reed M.D.   On: 04/20/2021 09:35    Procedures Procedures    Medications Ordered in ED Medications  iohexol (OMNIPAQUE) 300 MG/ML solution 75 mL (75 mLs Intravenous Contrast Given 04/20/21 0905)  oxyCODONE-acetaminophen (PERCOCET/ROXICET) 5-325 MG per tablet 1 tablet (1 tablet Oral Given 04/20/21 1058)    ED Course/ Medical Decision Making/ A&P  Medical Decision Making 31 year old female who presents to the ED today with complaint of diffuse abdominal pain, worsening yesterday.  History of recent cholecystectomy that was complicated by bile leak, currently has JP drain in place with plans to see Dr. [name] on Monday.  Abdominal pain exacerbated after her large dog stepped on her abdomen yesterday.  On arrival to the ED vitals are stable.  Patient is afebrile, nontachycardic and nontachypneic.  She does appear anxious on exam at this time.  Does endorse  increased output of her JP drain.  JP drain with honey colored output which seems consistent with previous output during admission last week.  Labs were collected while in the waiting room including CBC, CMP, lipase which have all returned unremarkable.  On my exam she is diffuse abdominal tenderness palpation throughout exam.  He reports that this is worse than normal today.  Unresolved with tramadol at home.  Given recent surgery with JP drain in place we will plan for CT scan for further evaluation to assess if JP drain functioning properly, concern for other abnormality however do suspect likely soft tissue injury related to large dog.  Patient provided with pain medication, will reevaluate.  Pt refused morphine at this time. Reports she is concerned about "heavy" pain medication. It does appear she is taking Tramadol at home without improvement in symptoms. Will attempt PO pain meds after CT scan which pt is in agreement to.   CT scan without acute findings. JP drain in place with almost complete resolution of bile leak. Will PO trial with pain medicine and consider sending home with same. Pt instructed to keep appointment with Dr. [name] next week. Do not feel general surgery needs to be consulted at this time given overall reassuring labs and CT scan.   Pt pain improved. Discharged home with short course of same. Stable for discharge home at this time.   Problems Addressed: Generalized abdominal pain: acute illness or injury  Amount and/or Complexity of Data Reviewed Labs: ordered. Radiology: ordered.    Details: CT: IMPRESSION:  1. No acute abnormalities on today's examination.  2. Surgical drain in position with near complete resolution of  previously noted intra-abdominal fluid.  3. Tiny postoperative fluid collection in the gallbladder fossa  nearly completely resolved compared to the prior examination.  4. Additional incidental findings, as above.  Risk Prescription drug  management.          Final Clinical Impression(s) / ED Diagnoses Final diagnoses:  None    Rx / DC Orders ED Discharge Orders     None        Discharge Instructions      Your workup was overall reassuring in the ED today and your CT scan did not show any new findings. It did show improvement of your previous issues.   Please keep appointment with Dr. [name] as scheduled for Monday  Pick up pain medication and take as needed. Do not take this with additional Tylenol. Do not take this as well as Tramadol.   Return to the ED for any new/worsening symptoms       Tanda Rockers, Cordelia Poche 04/20/21 1114    Linwood Dibbles, MD 04/21/21 [phone]

## 2021-04-20 NOTE — ED Notes (Signed)
Patient transported to CT 

## 2021-04-20 NOTE — ED Triage Notes (Signed)
Pt reported to ED with c/o upper abdominal pain x1 day that has worsened over the evening. Pt reports previous cholecystomy, with complications. Has JP drain in place. Denies any N/V at this time. Pt appears anxious at time of triage.

## 2022-10-26 IMAGING — MR MR ABDOMEN WO/W CM MRCP
16 of 18 series · 44 of 48 positions shown · IV contrast (gadavist)
Comparison: CT abdomen earlier same day

CLINICAL DATA: Postop cholecystectomy. Question
choledocholithiasis.



[Series 4: ax haste · axial · 6.0mm · 1.12mm/px · 1 of 41 slices shown]
[im 1/41]
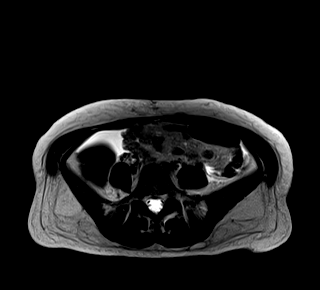

[Series 5: cor haste · coronal · 6.0mm · 1.25mm/px · 1 of 29 slices shown]
[im 1/29]
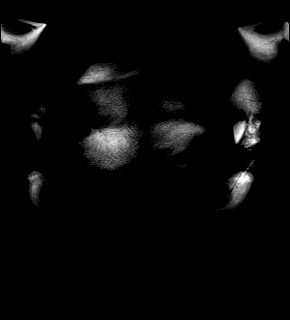

[Series 8: T2 fat-sat · axial · 6.0mm · 1.19mm/px · 1 of 28 slices shown (1 of 2)]
[im 1/28]
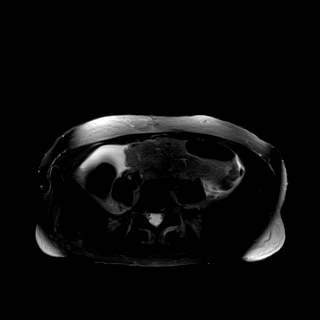

[Series 9: DWI · axial · 6.0mm · 1.42mm/px · z∈[-117,+171]mm · 4 of 123 slices shown (1 of 2)]
[im 1/123]
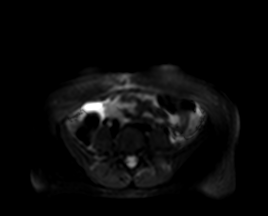
[im 41/123]
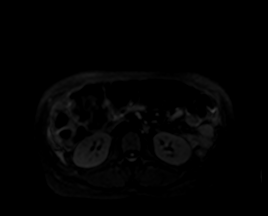
[im 82/123]
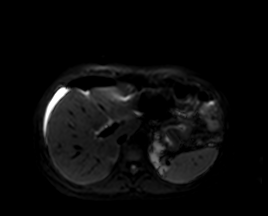
[im 123/123]
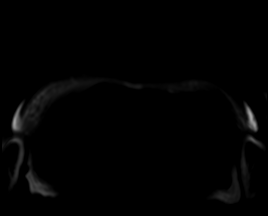

[Series 10: DWI · axial · 6.0mm · 1.42mm/px · z∈[-117,+171]mm · 2 of 41 slices shown (2 of 2)]
[im 1/41]
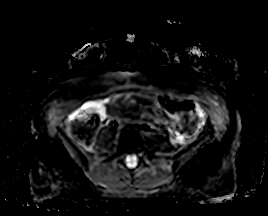
[im 41/41]
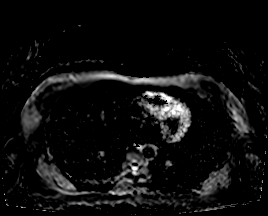

[Series 13: T2 fat-sat · axial · 6.0mm · 1.19mm/px · z∈[-117,+171]mm · 2 of 41 slices shown (2 of 2)]
[im 1/41]
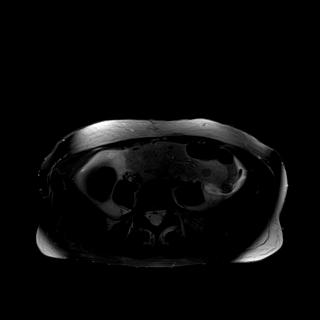
[im 41/41]
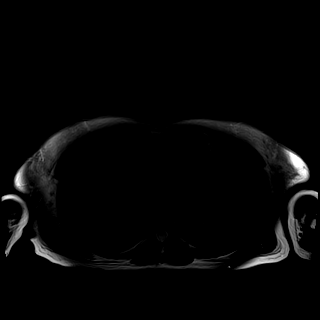

[Series 14: ax in and · axial · 3.0mm · 1.19mm/px · z∈[-107,+178]mm · 4 of 96 slices shown (1 of 2)]
[im 1/96]
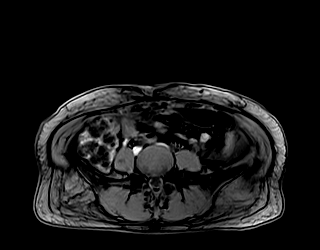
[im 32/96]
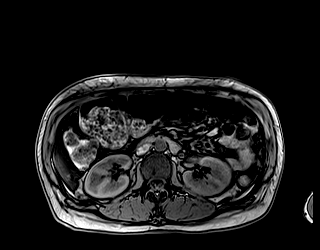
[im 64/96]
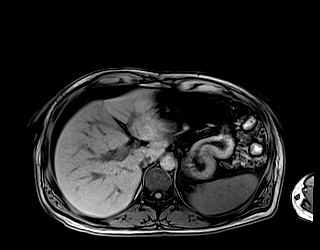
[im 96/96]
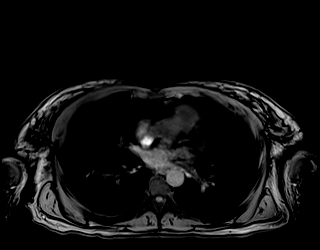

[Series 14: ax in and · axial · 3.0mm · 1.19mm/px · z∈[-107,+178]mm · 4 of 96 slices shown (2 of 2)]
[im 1/96]
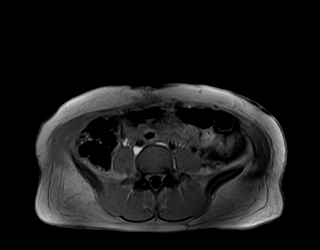
[im 32/96]
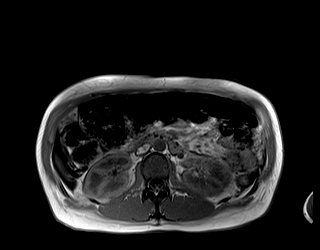
[im 64/96]
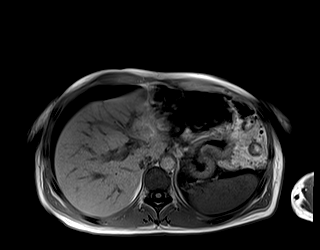
[im 96/96]
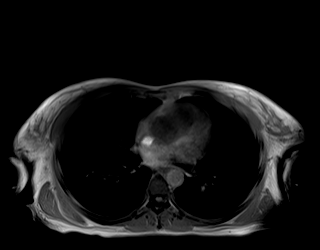

[Series 15: MRCP · coronal · 4.0mm · 1.12mm/px · 1 of 16 slices shown]
[im 1/16]
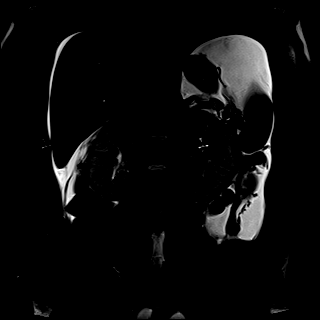

[Series 16: radials · coronal · 50.0mm · 0.78mm/px · 1 of 5 slices shown]
[im 1/5]
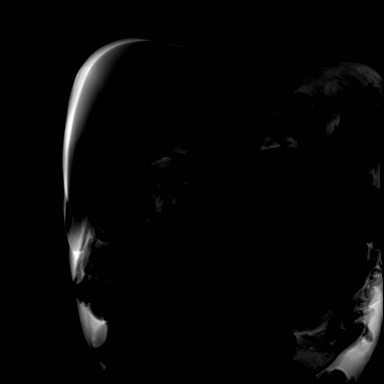

[Series 21: T1 dynamic · axial · non-contrast · 3.0mm · 1.19mm/px · z∈[-109,+176]mm · 4 of 96 slices shown (1 of 3)]
[im 1/96]
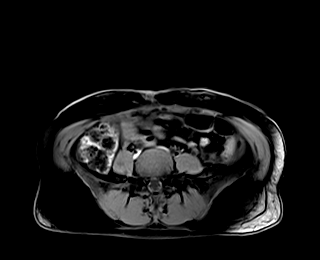
[im 32/96]
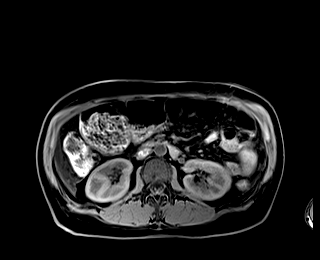
[im 64/96]
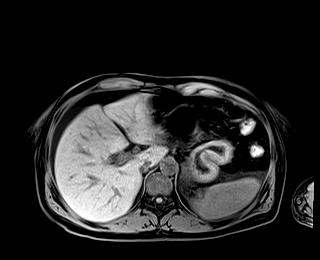
[im 96/96]
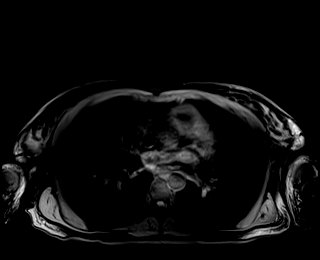

[Series 23: T1 dynamic post-contrast · axial · 3.0mm · 1.19mm/px · z∈[-109,+176]mm · 4 of 96 slices shown (1 of 3)]
[im 1/96]
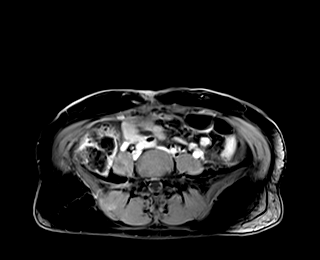
[im 32/96]
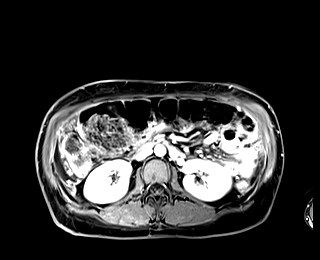
[im 64/96]
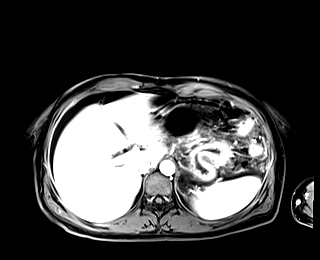
[im 96/96]
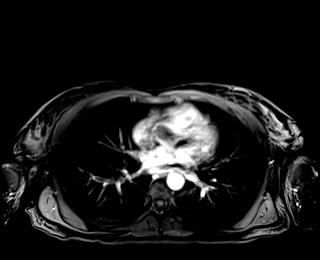

[Series 24: T1 dynamic · axial · 3.0mm · 1.19mm/px · z∈[-109,+176]mm · 4 of 96 slices shown (2 of 3)]
[im 1/96]
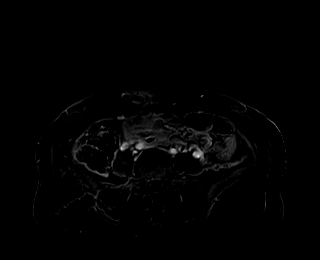
[im 32/96]
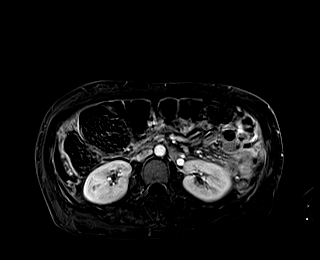
[im 64/96]
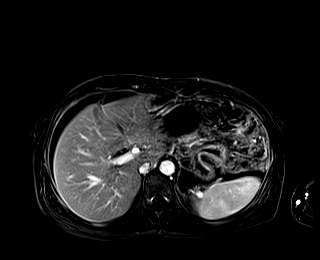
[im 96/96]
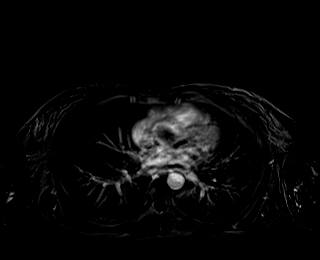

[Series 25: T1 dynamic post-contrast · axial · 3.0mm · 1.19mm/px · z∈[-109,+176]mm · 4 of 96 slices shown (2 of 3)]
[im 1/96]
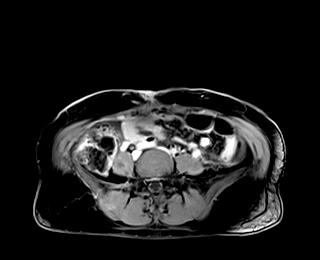
[im 32/96]
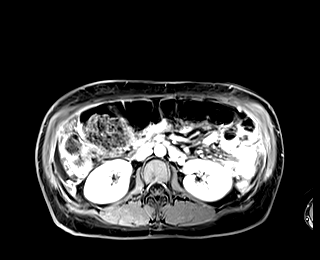
[im 64/96]
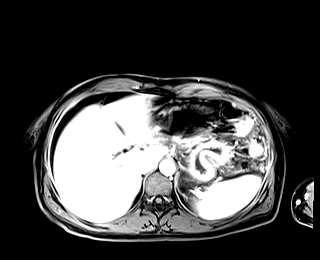
[im 96/96]
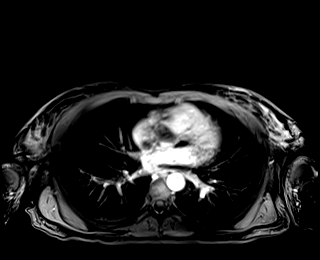

[Series 26: T1 dynamic · axial · 3.0mm · 1.19mm/px · z∈[-109,+176]mm · 4 of 96 slices shown (3 of 3)]
[im 1/96]
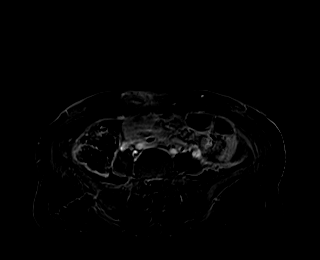
[im 32/96]
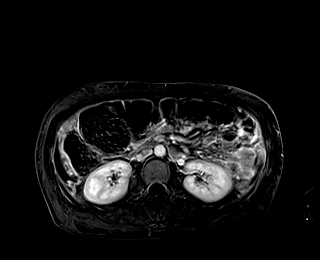
[im 64/96]
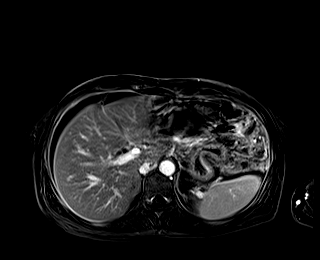
[im 96/96]
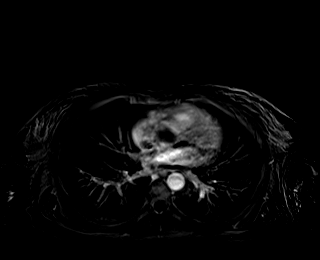

[Series 27: T1 dynamic post-contrast · coronal · 3.0mm · 1.31mm/px · 3 of 64 slices shown (3 of 3)]
[im 1/64]
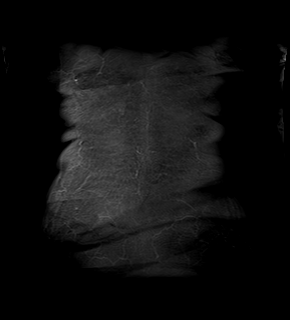
[im 32/64]
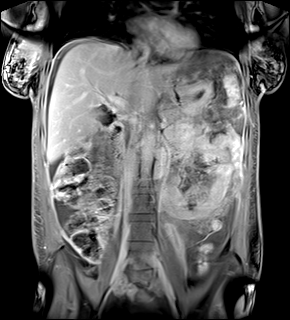
[im 64/64]
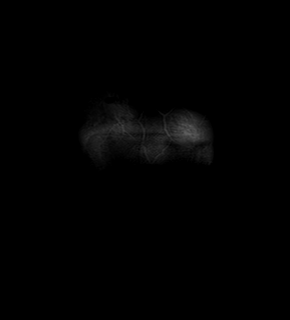

[44 of 48 positions shown; findings below may reference images not displayed]

FINDINGS: Lower chest: No abnormality seen.

Hepatobiliary: Recent cholecystectomy. Heterogeneous material in the
gallbladder fossa consistent with postoperative hematoma, an
expected finding. No intra or extrahepatic biliary ductal dilatation
is seen. There is good bili scratched at there is good visualization
of the biliary tree and no retained stones are identified. Patient
does have free fluid throughout the peritoneal space, with some free
air. The free air is not unexpected. The amount of fluid could
possibly indicate bile leak.

Pancreas:  Normal

Spleen:  Normal

Adrenals/Urinary Tract:  Normal

Stomach/Bowel: Normal except for what is probably a degree of
postoperative ileus.

Vascular/Lymphatic:  Normal

Other:  None

Musculoskeletal: Normal
IMPRESSION: Status post cholecystectomy. Heterogeneous material in the
gallbladder fossa consistent with postoperative bleeding, not
unexpected in degree or amount. Small amount of free intraperitoneal
air, also not unexpected. Fairly large amount of free
intraperitoneal fluid raising the possibility of bile leak. This
could be confirmed or excluded with nuclear medicine hepatobiliary
scan. No biliary ductal dilatation. No ductal filling defects to
suggest choledocholithiasis.

## 2022-11-03 IMAGING — CT CT ABD-PELV W/ CM
2 of 4 series · 15 of 46 positions shown, 17 images · IV contrast (agent unspecified)
Comparison: CT the abdomen and pelvis 04/12/2021.

CLINICAL DATA: 30-year-old female with history of recent
cholecystectomy complicated by bile leak. Worsening abdominal pain
and increased drain output.

EXAM:
CT ABDOMEN AND PELVIS WITH CONTRAST
TECHNIQUE: Multidetector CT imaging of the abdomen and pelvis was performed
using the standard protocol following bolus administration of
intravenous contrast.

[Series 3: a/p w/ 5mm · axial · 0.63mm/px · z∈[-1141,-761]mm · 12 of 90 slices shown, 14 images]
[im 7/90  soft-tissue]
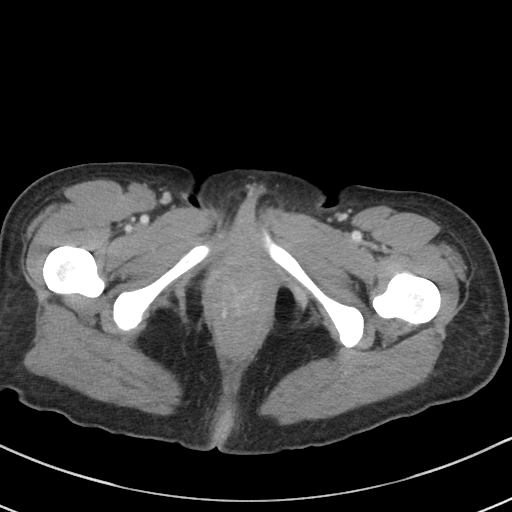
[im 7/90  bone]
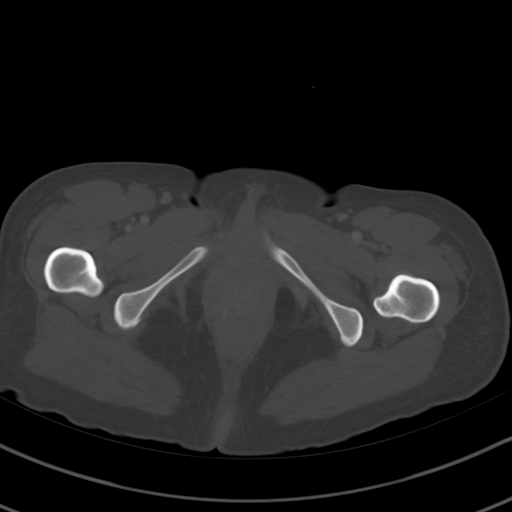
[im 14/90  soft-tissue]
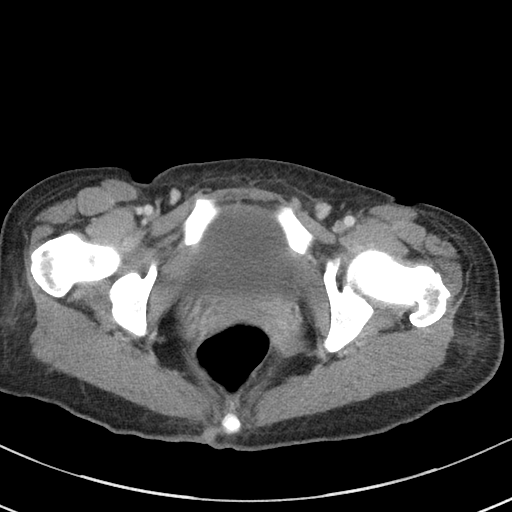
[im 21/90  soft-tissue]
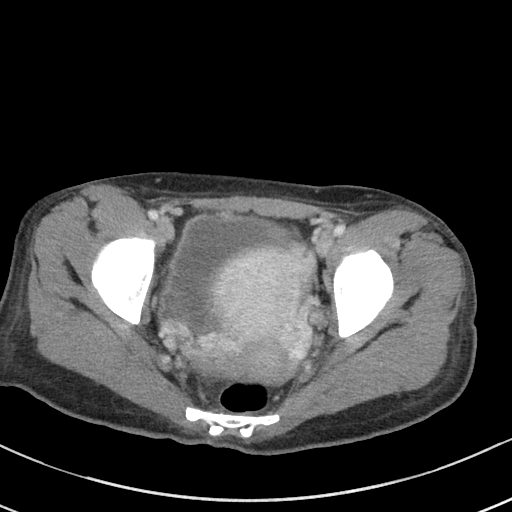
[im 28/90  soft-tissue]
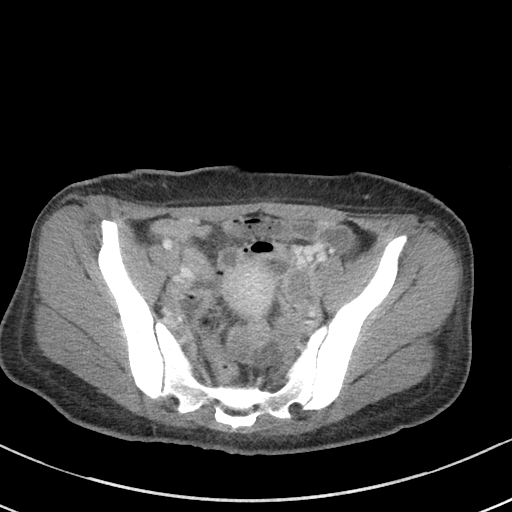
[im 35/90  soft-tissue]
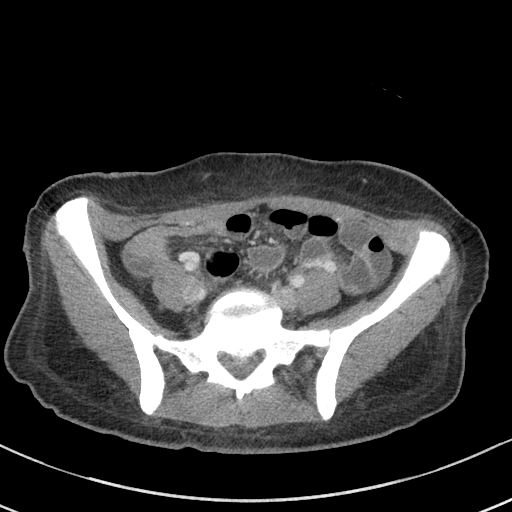
[im 42/90  soft-tissue]
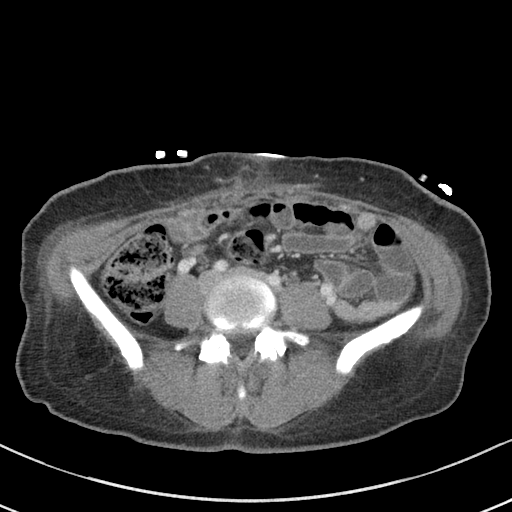
[im 48/90  soft-tissue]
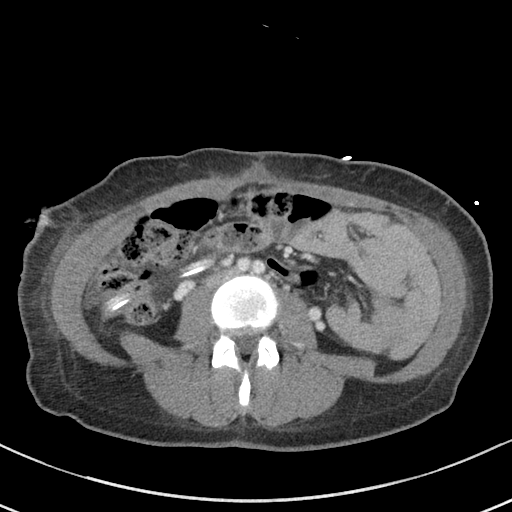
[im 55/90  soft-tissue]
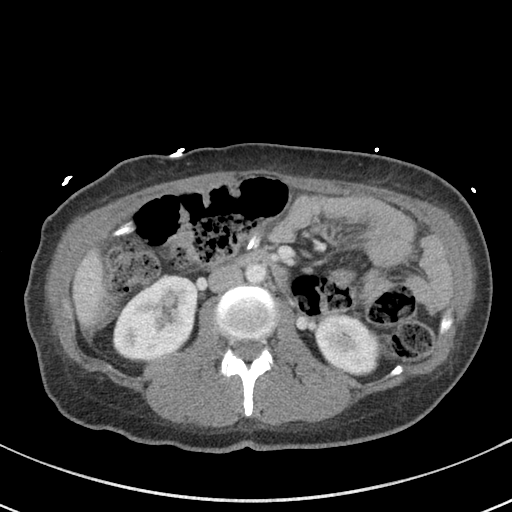
[im 62/90  soft-tissue]
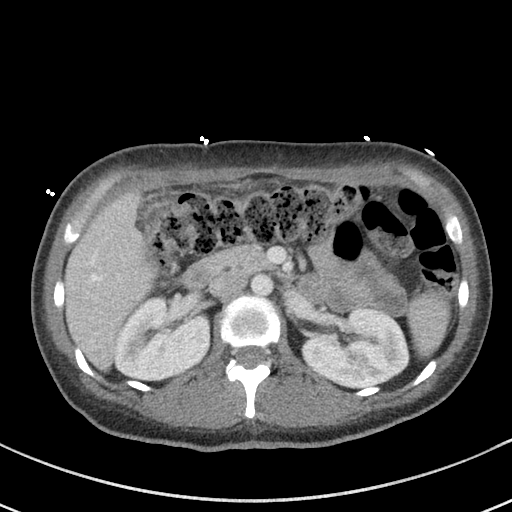
[im 62/90  bone]
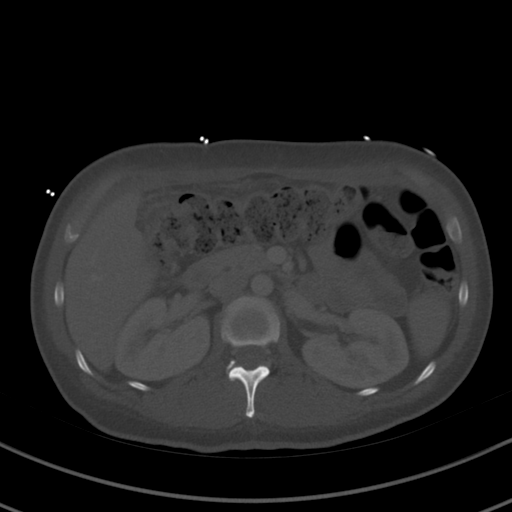
[im 69/90  soft-tissue]
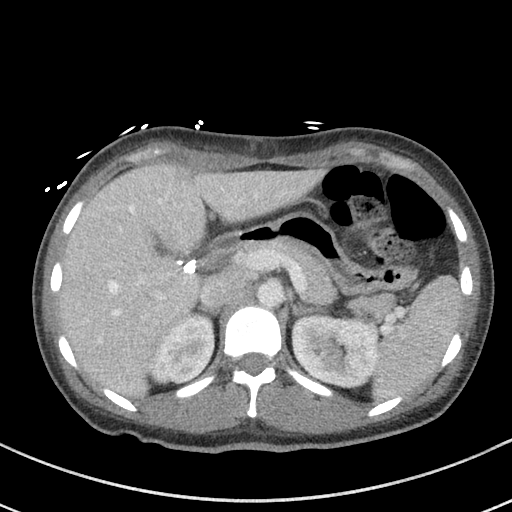
[im 76/90  soft-tissue]
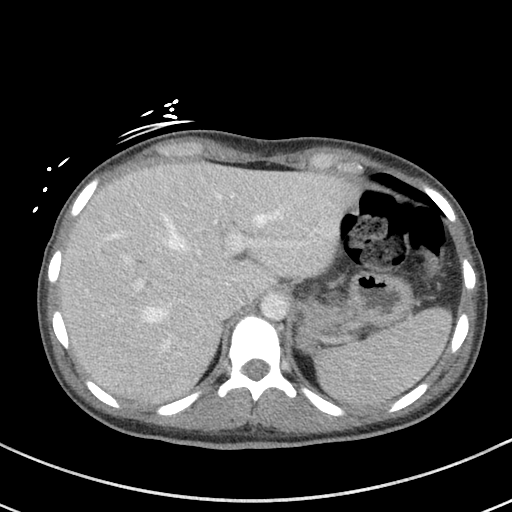
[im 83/90  soft-tissue]
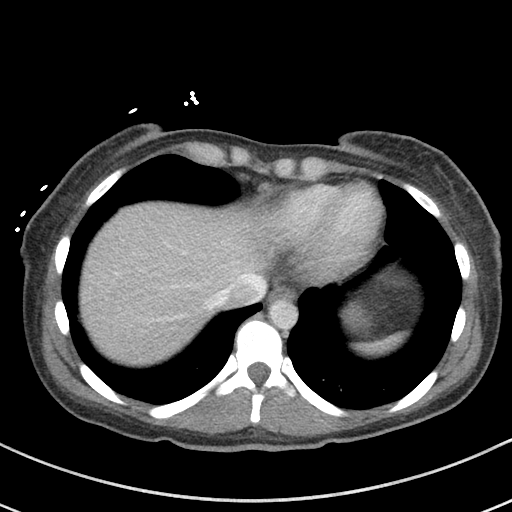

[Series 6: a/p w/ cor · coronal · 0.70mm/px · 3 of 93 slices shown]
[im 31/93  soft-tissue]
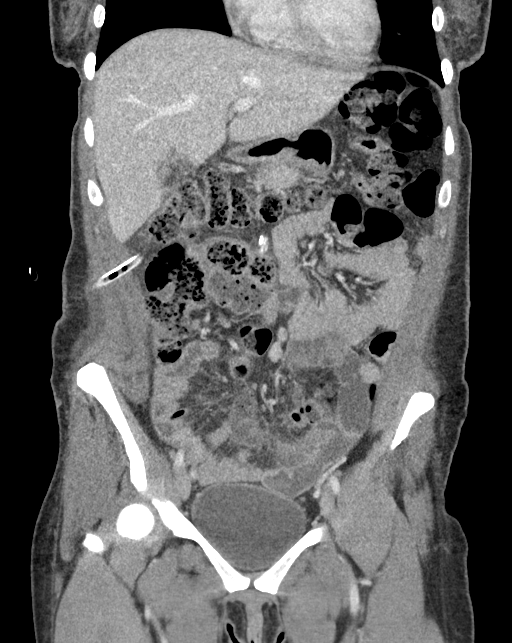
[im 41/93  soft-tissue]
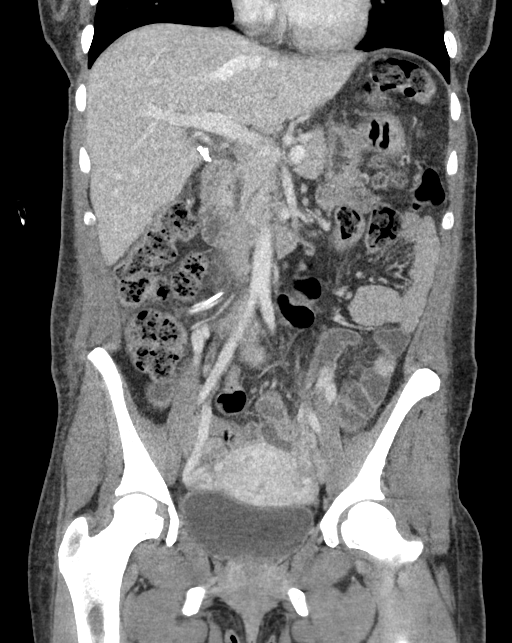
[im 52/93  soft-tissue]
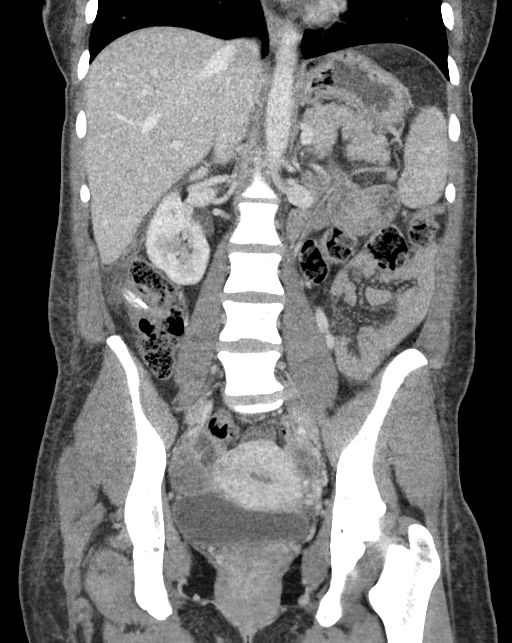

[15 of 46 positions shown; findings below may reference images not displayed]

RADIATION DOSE REDUCTION: This exam was performed according to the
departmental dose-optimization program which includes automated
exposure control, adjustment of the mA and/or kV according to
patient size and/or use of iterative reconstruction technique.

CONTRAST:  75mL OMNIPAQUE IOHEXOL 300 MG/ML  SOLN
FINDINGS: Lower chest: Unremarkable.

Hepatobiliary: No suspicious cystic or solid hepatic lesions. No
intra or extrahepatic biliary ductal dilatation. Status post
cholecystectomy. Previously noted postoperative fluid collection in
the gallbladder fossa has nearly completely resolved.

Pancreas: No pancreatic mass. No pancreatic ductal dilatation. No
pancreatic or peripancreatic fluid collections or inflammatory
changes.

Spleen: Unremarkable.

Adrenals/Urinary Tract: Bilateral kidneys and adrenal glands are
normal in appearance. No hydroureteronephrosis. Urinary bladder is
normal in appearance.

Stomach/Bowel: The appearance of the stomach is normal. There is no
pathologic dilatation of small bowel or colon. Appendix is not
confidently identified may be surgically absent.

Vascular/Lymphatic: No significant atherosclerotic disease, aneurysm
or dissection noted in the abdominal or pelvic vasculature. No
lymphadenopathy noted in the abdomen or pelvis.

Reproductive: Uterus and ovaries are unremarkable in appearance.

Other: Surgical drain in position in the right upper quadrant of the
abdomen. Trace amount of fluid adjacent to the tip of the drainage
catheter, decreased substantially compared to the prior examination.
No residual pneumoperitoneum.

Musculoskeletal: Soft tissue stranding and enhancement in the
periumbilical region, similar to the prior examination, presumably
healing at the site of recent laparoscopy port placement. There are
no aggressive appearing lytic or blastic lesions noted in the
visualized portions of the skeleton.
IMPRESSION: 1. No acute abnormalities on today's examination.
2. Surgical drain in position with near complete resolution of
previously noted intra-abdominal fluid.
3. Tiny postoperative fluid collection in the gallbladder fossa
nearly completely resolved compared to the prior examination.
4. Additional incidental findings, as above.
